# Patient Record
Sex: Male | Born: 1970 | Race: Black or African American | Hispanic: No | Marital: Single | Smoking: Never smoker
Health system: Southern US, Community
[De-identification: ages and names within clinical notes are randomized; demographics above are authoritative.]

## PROBLEM LIST (undated history)

## (undated) DIAGNOSIS — I10 Essential (primary) hypertension: Secondary | ICD-10-CM

## (undated) DIAGNOSIS — F1010 Alcohol abuse, uncomplicated: Secondary | ICD-10-CM

## (undated) DIAGNOSIS — B182 Chronic viral hepatitis C: Secondary | ICD-10-CM

## (undated) DIAGNOSIS — K2961 Other gastritis with bleeding: Secondary | ICD-10-CM

## (undated) DIAGNOSIS — F1910 Other psychoactive substance abuse, uncomplicated: Secondary | ICD-10-CM

## (undated) DIAGNOSIS — B181 Chronic viral hepatitis B without delta-agent: Secondary | ICD-10-CM

## (undated) DIAGNOSIS — K2210 Ulcer of esophagus without bleeding: Secondary | ICD-10-CM

## (undated) HISTORY — DX: Other gastritis with bleeding: K29.61

## (undated) HISTORY — DX: Ulcer of esophagus without bleeding: K22.10

## (undated) HISTORY — DX: Chronic viral hepatitis C: B18.2

## (undated) HISTORY — DX: Chronic viral hepatitis B without delta-agent: B18.1

---

## 2009-09-05 ENCOUNTER — Emergency Department (HOSPITAL_COMMUNITY): Admission: EM | Admit: 2009-09-05 | Discharge: 2009-09-06 | Payer: Self-pay | Admitting: Emergency Medicine

## 2009-09-27 ENCOUNTER — Emergency Department (HOSPITAL_COMMUNITY): Admission: EM | Admit: 2009-09-27 | Discharge: 2009-09-27 | Payer: Self-pay | Admitting: Emergency Medicine

## 2010-01-30 ENCOUNTER — Emergency Department (HOSPITAL_COMMUNITY): Admission: EM | Admit: 2010-01-30 | Discharge: 2010-01-31 | Payer: Self-pay | Admitting: Emergency Medicine

## 2010-01-31 ENCOUNTER — Ambulatory Visit: Payer: Self-pay | Admitting: Psychiatry

## 2010-01-31 ENCOUNTER — Inpatient Hospital Stay (HOSPITAL_COMMUNITY): Admission: AD | Admit: 2010-01-31 | Discharge: 2010-02-06 | Payer: Self-pay | Admitting: Psychiatry

## 2010-05-31 LAB — URINALYSIS, ROUTINE W REFLEX MICROSCOPIC
Nitrite: NEGATIVE
Protein, ur: NEGATIVE mg/dL
Specific Gravity, Urine: 1.008 (ref 1.005–1.030)
Urobilinogen, UA: 0.2 mg/dL (ref 0.0–1.0)

## 2010-05-31 LAB — DIFFERENTIAL
Basophils Absolute: 0 10*3/uL (ref 0.0–0.1)
Basophils Relative: 1 % (ref 0–1)
Eosinophils Relative: 1 % (ref 0–5)
Lymphocytes Relative: 52 % — ABNORMAL HIGH (ref 12–46)
Monocytes Absolute: 0.5 10*3/uL (ref 0.1–1.0)
Neutro Abs: 1.4 10*3/uL — ABNORMAL LOW (ref 1.7–7.7)

## 2010-05-31 LAB — CBC
Hemoglobin: 15.7 g/dL (ref 13.0–17.0)
MCH: 30.9 pg (ref 26.0–34.0)
MCV: 89.2 fL (ref 78.0–100.0)
Platelets: 197 10*3/uL (ref 150–400)
RBC: 5.07 MIL/uL (ref 4.22–5.81)
WBC: 4.1 10*3/uL (ref 4.0–10.5)

## 2010-05-31 LAB — COMPREHENSIVE METABOLIC PANEL
AST: 134 U/L — ABNORMAL HIGH (ref 0–37)
Albumin: 4.1 g/dL (ref 3.5–5.2)
Alkaline Phosphatase: 57 U/L (ref 39–117)
BUN: 8 mg/dL (ref 6–23)
CO2: 27 mEq/L (ref 19–32)
Chloride: 105 mEq/L (ref 96–112)
Creatinine, Ser: 0.98 mg/dL (ref 0.4–1.5)
GFR calc non Af Amer: >60 mL/min (ref >60)
Potassium: 4.1 mEq/L (ref 3.5–5.1)
Total Bilirubin: 1 mg/dL (ref 0.3–1.2)

## 2010-05-31 LAB — RAPID URINE DRUG SCREEN, HOSP PERFORMED
Barbiturates: NOT DETECTED
Opiates: NOT DETECTED

## 2010-06-05 LAB — CBC
HCT: 40.7 % (ref 39.0–52.0)
MCHC: 34.4 g/dL (ref 30.0–36.0)
Platelets: 159 10*3/uL (ref 150–400)
RDW: 16.5 % — ABNORMAL HIGH (ref 11.5–15.5)
WBC: 8.1 10*3/uL (ref 4.0–10.5)

## 2010-06-05 LAB — POCT I-STAT, CHEM 8
BUN: 11 mg/dL (ref 6–23)
Creatinine, Ser: 1 mg/dL (ref 0.4–1.5)
Glucose, Bld: 114 mg/dL — ABNORMAL HIGH (ref 70–99)
Sodium: 141 mEq/L (ref 135–145)
TCO2: 23 mmol/L (ref 0–100)

## 2010-06-05 LAB — DIFFERENTIAL
Basophils Relative: 1 % (ref 0–1)
Eosinophils Absolute: 0.1 10*3/uL (ref 0.0–0.7)
Eosinophils Relative: 1 % (ref 0–5)
Lymphs Abs: 1.5 10*3/uL (ref 0.7–4.0)
Monocytes Absolute: 0.9 10*3/uL (ref 0.1–1.0)
Monocytes Relative: 11 % (ref 3–12)
Neutrophils Relative %: 70 % (ref 43–77)

## 2010-06-05 LAB — COMPREHENSIVE METABOLIC PANEL
ALT: 78 U/L — ABNORMAL HIGH (ref 0–53)
AST: 70 U/L — ABNORMAL HIGH (ref 0–37)
Albumin: 3.6 g/dL (ref 3.5–5.2)
Alkaline Phosphatase: 81 U/L (ref 39–117)
Calcium: 9.6 mg/dL (ref 8.4–10.5)
GFR calc Af Amer: >60 mL/min (ref >60)
Potassium: 4.4 mEq/L (ref 3.5–5.1)
Sodium: 138 mEq/L (ref 135–145)
Total Protein: 8 g/dL (ref 6.0–8.3)

## 2010-08-30 ENCOUNTER — Emergency Department (HOSPITAL_COMMUNITY)
Admission: EM | Admit: 2010-08-30 | Discharge: 2010-08-31 | Disposition: A | Discharge status: Other Acute Inpatient Hospital | Payer: Medicare Other | Source: Home / Self Care | Attending: Emergency Medicine | Admitting: Emergency Medicine

## 2010-08-30 DIAGNOSIS — F1010 Alcohol abuse, uncomplicated: Secondary | ICD-10-CM | POA: Insufficient documentation

## 2010-08-30 DIAGNOSIS — Z79899 Other long term (current) drug therapy: Secondary | ICD-10-CM | POA: Insufficient documentation

## 2010-08-30 DIAGNOSIS — F1410 Cocaine abuse, uncomplicated: Secondary | ICD-10-CM | POA: Insufficient documentation

## 2010-08-30 DIAGNOSIS — F341 Dysthymic disorder: Secondary | ICD-10-CM | POA: Insufficient documentation

## 2010-08-30 LAB — COMPREHENSIVE METABOLIC PANEL
AST: 154 U/L — ABNORMAL HIGH (ref 0–37)
Albumin: 3.6 g/dL (ref 3.5–5.2)
Alkaline Phosphatase: 59 U/L (ref 39–117)
Chloride: 102 mEq/L (ref 96–112)
Potassium: 3.9 mEq/L (ref 3.5–5.1)
Total Bilirubin: 0.6 mg/dL (ref 0.3–1.2)

## 2010-08-30 LAB — RAPID URINE DRUG SCREEN, HOSP PERFORMED
Barbiturates: NOT DETECTED
Cocaine: POSITIVE — AB
Tetrahydrocannabinol: NOT DETECTED

## 2010-08-30 LAB — DIFFERENTIAL
Basophils Absolute: 0 10*3/uL (ref 0.0–0.1)
Eosinophils Absolute: 0.1 10*3/uL (ref 0.0–0.7)
Eosinophils Relative: 2 % (ref 0–5)
Lymphocytes Relative: 43 % (ref 12–46)
Neutrophils Relative %: 41 % — ABNORMAL LOW (ref 43–77)

## 2010-08-30 LAB — CBC
HCT: 43.4 % (ref 39.0–52.0)
Platelets: 180 10*3/uL (ref 150–400)
RBC: 4.95 MIL/uL (ref 4.22–5.81)
RDW: 14.3 % (ref 11.5–15.5)
WBC: 4.5 10*3/uL (ref 4.0–10.5)

## 2010-08-31 ENCOUNTER — Inpatient Hospital Stay (HOSPITAL_COMMUNITY)
Admission: AD | Admit: 2010-08-31 | Discharge: 2010-09-06 | DRG: 897 | Disposition: A | Discharge status: Home / Self Care | Payer: Medicare Other | Source: Ambulatory Visit | Attending: Psychiatry | Admitting: Psychiatry

## 2010-08-31 DIAGNOSIS — Z5900 Homelessness unspecified: Secondary | ICD-10-CM

## 2010-08-31 DIAGNOSIS — F29 Unspecified psychosis not due to a substance or known physiological condition: Secondary | ICD-10-CM

## 2010-08-31 DIAGNOSIS — R197 Diarrhea, unspecified: Secondary | ICD-10-CM

## 2010-08-31 DIAGNOSIS — F1010 Alcohol abuse, uncomplicated: Secondary | ICD-10-CM

## 2010-08-31 DIAGNOSIS — B1920 Unspecified viral hepatitis C without hepatic coma: Secondary | ICD-10-CM

## 2010-08-31 DIAGNOSIS — B1910 Unspecified viral hepatitis B without hepatic coma: Secondary | ICD-10-CM

## 2010-08-31 DIAGNOSIS — J310 Chronic rhinitis: Secondary | ICD-10-CM

## 2010-08-31 DIAGNOSIS — Z91199 Patient's noncompliance with other medical treatment and regimen due to unspecified reason: Secondary | ICD-10-CM

## 2010-08-31 DIAGNOSIS — Z9119 Patient's noncompliance with other medical treatment and regimen: Secondary | ICD-10-CM

## 2010-08-31 DIAGNOSIS — F1920 Other psychoactive substance dependence, uncomplicated: Principal | ICD-10-CM

## 2010-09-01 DIAGNOSIS — F1920 Other psychoactive substance dependence, uncomplicated: Secondary | ICD-10-CM

## 2010-09-05 LAB — HEPATIC FUNCTION PANEL
ALT: 153 U/L — ABNORMAL HIGH (ref 0–53)
Alkaline Phosphatase: 77 U/L (ref 39–117)
Bilirubin, Direct: 0.1 mg/dL (ref 0.0–0.3)
Indirect Bilirubin: 0.2 mg/dL — ABNORMAL LOW (ref 0.3–0.9)
Total Bilirubin: 0.3 mg/dL (ref 0.3–1.2)

## 2010-09-06 NOTE — H&P (Signed)
  NAMEFAVIAN, Brandon [name] NO.:  [id number]  MEDICAL RECORD NO.:  [id number]  LOCATION:  0401                          FACILITY:  [facility]  PHYSICIAN:  Eulogio Ditch, MD DATE OF BIRTH:  06/29/1970  DATE OF ADMISSION:  08/31/2010 DATE OF DISCHARGE:                      PSYCHIATRIC ADMISSION ASSESSMENT   REASON FOR ADMISSION:  Hearing voices.  HISTORY OF PRESENT ILLNESS:  A 40 year old male with history of abusing cocaine, alcohol and marijuana, came to the ED for reporting hearing voices and wanted help regarding his medications and residential placement.  I saw the patient along with the case manager.  The patient was very logical and goal directed during the interview.  No psychotic symptoms noted during the interview.  On asking about the voices, the patient early was saying that heard voices but then on further questioning he reported that he thinks these are the dreams only and he does not hear voices.  His main issue is the drugs and he wants help for that.  The patient reported depressed mood but denied any suicidal ideations.  SUBSTANCE ABUSE:  The patient has a chronic history of abusing alcohol, cocaine and marijuana.  The patient has admission in the past at [facility] and in [place] for drug abuse.  The patient was also given a diagnosis of schizophrenia in the past.  CURRENT PSYCH MEDICATION:  The patient is on Zoloft and Seroquel but was noncompliant with those medications.  MEDICAL HISTORY:  History of hepatitis B, hepatitis C.  ALLERGIES:  ASPIRIN.  PHYSICAL EXAMINATION:  Physical examination done in [facility] within normal limits.  Vitals within normal limits.  Labs within normal limits.  MENTAL STATUS EXAM:  The patient is calm, cooperative during interview. Fair eye contact.  No abnormal movements noticed.  No psychomotor agitation, retardation noted during the interview.  His speech is normal in rate, rhythm and volume.   Mood depressed.  Affect, mood congruent. Thought process logical and goal-directed.  Not delusional, not hallucinating, not internally preoccupied.  The patient denies having any suicidal or homicidal ideations.  Cognition:  Alert, awake, oriented x3.  Memory:  Immediate, recent remote fair.  Attention and concentration fair.  Abstraction ability good.  Insight and judgment intact.  DIAGNOSIS:  Axis I:  Polysubstance dependence, psychosis not otherwise specified. Axis II:  Deferred. Axis III:  See medical notes. Axis IV:  Chronic substance abuse. Axis V:  40.  TREATMENT PLAN: 1. The patient will be started on Risperdal 2 mg at bedtime for     complaint of hearing voices and it can be related to the drug     abuse. 2. Case manager is going to look for a rehab facility for the patient. 3. Estimated length of stay in the hospital will be 5-7 days. 4. At this time the patient has no withdrawal symptoms and he will not     be started on any detox protocol.     Eulogio Ditch, MD     SA/MEDQ  D:  09/01/2010  T:  09/01/2010  Job:  [id number]  Electronically Signed by Eulogio Ditch  on 09/06/2010 10:03:48 AM

## 2010-09-13 NOTE — Discharge Summary (Signed)
  Brandon [name], Brandon [name]           ACCOUNT NO.:  [id number]  MEDICAL RECORD NO.:  [id number]  LOCATION:  0300                          FACILITY:  [facility]  PHYSICIAN:  Eulogio Ditch, MD DATE OF BIRTH:  October 31, 1970  DATE OF ADMISSION:  08/31/2010 DATE OF DISCHARGE:  09/06/2010                              DISCHARGE SUMMARY   REASON FOR ADMISSION:  The patient stated he was hearing voices.  This is an 40 year old male abusing cocaine, alcohol, marijuana, hearing voices and wanting help regarding his medications and residential placement.  FINAL DIAGNOSES:  Axis I:  Polysubstance dependence, psychosis not otherwise specified. Axis II:  Deferred. Axis III:  History of hepatitis B and hepatitis C. Axis IV:  Chronic substance use, noncompliance, living situation. Axis V:  50.  PERTINENT LABS:  Urine drug screen positive for cocaine.  CBC within normal limits, does show neutrophils at 41.  CMP shows an SGOT elevated at 154 and an SGPT elevated at 171.  Alcohol level less than 11.  SIGNIFICANT FINDINGS:  The patient was admitted to the substance group, addressing his substance use and identifying any comorbidities.  We started the patient on Risperdal for his voices.  Case manager was going to pursue placement.  He was attending group and actively participating. His major concerns were his homelessness, having no family around, wanting to get treatment for alcohol and depression.  He wanted to get back on his Zoloft which we resumed.  He was having some problems with diarrhea and wanting his Seroquel rather than Risperdal restarted, stating that it had worked well in the past.  He was improving.  He was having no suicidal or homicidal thoughts.  He was alert and oriented. His mood was somewhat anxious.  His affect was congruent.  We did discontinue his Risperdal and started the patient on Seroquel 200 mg. On day of discharge, the patient was feeling better with the Seroquel, having  no problems with sleep, having some diarrhea.  He stated it was not bothersome.  He was planning to go to a shelter today.  DISCHARGE MEDICATIONS: 1. Seroquel 200 mg q.h.s. 2. Zoloft 50 mg for depression.  FOLLOWUP:  With [facility] on 6/20 at 8:00 a.m. at phone number [phone].     Landry Corporal, N.P.   ______________________________ Eulogio Ditch, MD    JO/MEDQ  D:  09/06/2010  T:  09/06/2010  Job:  [id number]  Electronically Signed by Limmie PatriciaP. on 09/07/2010 09:32:36 AM Electronically Signed by Eulogio Ditch  on 09/13/2010 12:07:47 PM

## 2016-03-15 NOTE — Congregational Nurse Program (Signed)
Congregational Nurse Program Note  Date of Encounter: 03/15/2016  Past Medical History: No past medical history on file.  Encounter Details:     CNP Questionnaire - 03/15/16 1125      Patient Demographics   Is this a new or existing patient? New   Patient is considered a/an Not Applicable   Race African-American/Black     Patient Assistance   Location of Patient Assistance Not Applicable   Patient's financial/insurance status Low Income;Medicaid   Patient referred to apply for the following financial assistance Not Applicable   Food insecurities addressed Not Applicable   Transportation assistance Yes   Type of Assistance Bus Pass Given   Assistance securing medications No   Educational health offerings Behavioral health;Navigating the healthcare system     Encounter Details   Primary purpose of visit Chronic Illness/Condition Visit;Navigating the Healthcare System   Was an Emergency Department visit averted? Not Applicable   Does patient have a medical provider? No   Patient referred to Clinic;Area Agency   Was a mental health screening completed? (GAINS tool) No   Does patient have dental issues? No   Does patient have vision issues? No   Does your patient have an abnormal blood pressure today? No   Since previous encounter, have you referred patient for abnormal blood pressure that resulted in a new diagnosis or medication change? No   Does your patient have an abnormal blood glucose today? No   Since previous encounter, have you referred patient for abnormal blood glucose that resulted in a new diagnosis or medication change? No   Was there a life-saving intervention made? No     Requesting assistance with obtaining medications.  [facility].  Provided information, directions and bus passes.  Client also wanted to get his SSD changed.  Provided information about going to [facility]

## 2016-03-21 ENCOUNTER — Other Ambulatory Visit: Payer: Self-pay

## 2016-03-30 ENCOUNTER — Emergency Department (HOSPITAL_COMMUNITY)
Admission: EM | Admit: 2016-03-30 | Discharge: 2016-03-31 | Disposition: A | Discharge status: Home / Self Care | Payer: Self-pay | Attending: Emergency Medicine | Admitting: Emergency Medicine

## 2016-03-30 ENCOUNTER — Encounter (HOSPITAL_COMMUNITY): Payer: Self-pay | Admitting: Emergency Medicine

## 2016-03-30 DIAGNOSIS — F17210 Nicotine dependence, cigarettes, uncomplicated: Secondary | ICD-10-CM | POA: Insufficient documentation

## 2016-03-30 DIAGNOSIS — I10 Essential (primary) hypertension: Secondary | ICD-10-CM | POA: Insufficient documentation

## 2016-03-30 DIAGNOSIS — R45851 Suicidal ideations: Secondary | ICD-10-CM | POA: Insufficient documentation

## 2016-03-30 DIAGNOSIS — F1910 Other psychoactive substance abuse, uncomplicated: Secondary | ICD-10-CM | POA: Insufficient documentation

## 2016-03-30 DIAGNOSIS — Z79899 Other long term (current) drug therapy: Secondary | ICD-10-CM | POA: Insufficient documentation

## 2016-03-30 HISTORY — DX: Essential (primary) hypertension: I10

## 2016-03-30 MED ORDER — VITAMIN B-1 100 MG PO TABS: 100.0000 mg | ORAL_TABLET | Freq: Every day | ORAL | Status: DC

## 2016-03-30 MED ORDER — FAMOTIDINE 20 MG PO TABS: 10.0000 mg | ORAL_TABLET | Freq: Every day | ORAL | Status: DC

## 2016-03-30 MED ORDER — TRAZODONE HCL 100 MG PO TABS: 100.0000 mg | ORAL_TABLET | Freq: Every evening | ORAL | Status: DC | PRN

## 2016-03-30 MED ORDER — ONDANSETRON HCL 4 MG PO TABS: 4.0000 mg | ORAL_TABLET | Freq: Three times a day (TID) | ORAL | Status: DC | PRN

## 2016-03-30 MED ORDER — THIAMINE HCL 100 MG/ML IJ SOLN: 100.0000 mg | Freq: Every day | INTRAMUSCULAR | Status: DC

## 2016-03-30 MED ORDER — ACETAMINOPHEN 325 MG PO TABS: 650.0000 mg | ORAL_TABLET | ORAL | Status: DC | PRN

## 2016-03-30 MED ORDER — ZOLPIDEM TARTRATE 5 MG PO TABS: 5.0000 mg | ORAL_TABLET | Freq: Every evening | ORAL | Status: DC | PRN

## 2016-03-30 MED ORDER — LORAZEPAM 2 MG/ML IJ SOLN
0.0000 mg | Freq: Four times a day (QID) | INTRAMUSCULAR | Status: DC
  Administered 2016-03-31: 2 mg via INTRAVENOUS
  Filled 2016-03-30: qty 1

## 2016-03-30 MED ORDER — LORAZEPAM 1 MG PO TABS: 1.0000 mg | ORAL_TABLET | Freq: Three times a day (TID) | ORAL | Status: DC | PRN

## 2016-03-30 MED ORDER — ALBUTEROL SULFATE HFA 108 (90 BASE) MCG/ACT IN AERS: 1.0000 | INHALATION_SPRAY | Freq: Four times a day (QID) | RESPIRATORY_TRACT | Status: DC | PRN

## 2016-03-30 MED ORDER — LORAZEPAM 2 MG/ML IJ SOLN: 0.0000 mg | Freq: Two times a day (BID) | INTRAMUSCULAR | Status: DC

## 2016-03-30 MED ORDER — SERTRALINE HCL 50 MG PO TABS: 100.0000 mg | ORAL_TABLET | Freq: Every day | ORAL | Status: DC

## 2016-03-30 NOTE — ED Provider Notes (Signed)
[facility]-EMERGENCY DEPT Provider Note   CSN: [id number] Arrival date & time: 03/30/16  2103     History   Chief Complaint Chief Complaint  Patient presents with  . Suicidal    HPI Brandon [name] is a 46 y.o. male.  HPI   Pt comes in with cc of suicidal ideation. Pt admits to cocaine use, heavy alcohol use. He has been having chronic suicidal thoughts, but the symptoms have gotten worse. PT is not taking home meds. Pt reports that he normally resides in [place] and staying here with a friend. Pt denies nausea, emesis, fevers, chills, chest pains, shortness of breath, headaches, abdominal pain, uti like symptoms.    Past Medical History:  Diagnosis Date  . Hypertension     There are no active problems to display for this patient.   History reviewed. No pertinent surgical history.     Home Medications    Prior to Admission medications   Medication Sig Start Date End Date Taking? Authorizing Provider  albuterol (PROVENTIL HFA;VENTOLIN HFA) 108 (90 Base) MCG/ACT inhaler Inhale 1-2 puffs into the lungs every 6 (six) hours as needed for wheezing or shortness of breath.   Yes Historical Provider, MD  famotidine (PEPCID) 10 MG tablet Take 10 mg by mouth daily.   Yes Historical Provider, MD  sertraline (ZOLOFT) 100 MG tablet Take 100 mg by mouth daily.   Yes Historical Provider, MD  traZODone (DESYREL) 100 MG tablet Take 100 mg by mouth at bedtime as needed for sleep.   Yes Historical Provider, MD    Family History No family history on file.  Social History Social History  Substance Use Topics  . Smoking status: Current Every Day Smoker    Packs/day: 0.50    Types: Cigarettes  . Smokeless tobacco: Never Used  . Alcohol use Yes     Comment: 5 drinks a day both beer and liquor     Allergies   Patient has no known allergies.   Review of Systems Review of Systems  ROS 10 Systems reviewed and are negative for acute change except as noted in the  HPI.     Physical Exam Updated Vital Signs BP 124/77 (BP Location: Right Arm)   Pulse 60   Temp 98.6 F (37 C) (Oral)   Resp 20   Ht 5\' 9"  (1.753 m)   Wt 145 lb (65.8 kg)   SpO2 98%   BMI 21.41 kg/m   Physical Exam  Constitutional: He is oriented to person, place, and time. He appears well-developed.  HENT:  Head: Atraumatic.  Neck: Neck supple.  Cardiovascular: Normal rate.   Pulmonary/Chest: Effort normal.  Neurological: He is alert and oriented to person, place, and time.  Skin: Skin is warm.  Nursing note and vitals reviewed.    ED Treatments / Results  Labs (all labs ordered are listed, but only abnormal results are displayed) Labs Reviewed  COMPREHENSIVE METABOLIC PANEL  ETHANOL  CBC WITH DIFFERENTIAL/PLATELET  RAPID URINE DRUG SCREEN, HOSP PERFORMED    EKG  EKG Interpretation None       Radiology No results found.  Procedures Procedures (including critical care time)  Medications Ordered in ED Medications - No data to display   Initial Impression / Assessment and Plan / ED Course  I have reviewed the triage vital signs and the nursing notes.  Pertinent labs & imaging results that were available during my care of the patient were reviewed by me and considered in my medical decision making (  see chart for details).  Clinical Course     DDx: Depression Bipolar disorder Schizophrenia Substance abuse Suicidal ideation Acute withdrawal  We will get basic labs and EKG. Whilst the labs are pending, pt has no medical complains and is medically cleared.  Final Clinical Impressions(s) / ED Diagnoses   Final diagnoses:  Suicidal ideation  Polysubstance abuse    New Prescriptions New Prescriptions   No medications on file     Derwood KaplanAnkit Barnabas Henriques, MD 03/30/16 2357

## 2016-03-30 NOTE — ED Triage Notes (Signed)
Pt states he is having suicidal thoughts. Pt states "living has not been too good." Pt states he has history of drug and alcohol abuse. Pt admits to drug use last night and alcohol use tonight. Pt had no plan today.

## 2016-03-30 NOTE — ED Notes (Signed)
ED Provider at bedside. 

## 2016-03-31 ENCOUNTER — Ambulatory Visit (HOSPITAL_COMMUNITY)
Admission: RE | Admit: 2016-03-31 | Discharge: 2016-03-31 | Disposition: A | Discharge status: Home / Self Care | Payer: Medicare HMO | Attending: Psychiatry | Admitting: Psychiatry

## 2016-03-31 DIAGNOSIS — R450 Nervousness: Secondary | ICD-10-CM | POA: Insufficient documentation

## 2016-03-31 DIAGNOSIS — F329 Major depressive disorder, single episode, unspecified: Secondary | ICD-10-CM | POA: Insufficient documentation

## 2016-03-31 DIAGNOSIS — G4700 Insomnia, unspecified: Secondary | ICD-10-CM | POA: Insufficient documentation

## 2016-03-31 LAB — COMPREHENSIVE METABOLIC PANEL
ALT: 72 U/L — ABNORMAL HIGH (ref 17–63)
ANION GAP: 5 (ref 5–15)
AST: 58 U/L — AB (ref 15–41)
Albumin: 3.4 g/dL — ABNORMAL LOW (ref 3.5–5.0)
Alkaline Phosphatase: 69 U/L (ref 38–126)
BUN: 20 mg/dL (ref 6–20)
CALCIUM: 8.4 mg/dL — AB (ref 8.9–10.3)
CO2: 24 mmol/L (ref 22–32)
CREATININE: 0.98 mg/dL (ref 0.61–1.24)
Chloride: 108 mmol/L (ref 101–111)
GLUCOSE: 96 mg/dL (ref 65–99)
Potassium: 4 mmol/L (ref 3.5–5.1)
SODIUM: 137 mmol/L (ref 135–145)
TOTAL PROTEIN: 7.3 g/dL (ref 6.5–8.1)
Total Bilirubin: 0.5 mg/dL (ref 0.3–1.2)

## 2016-03-31 LAB — CBC WITH DIFFERENTIAL/PLATELET
BASOS PCT: 0 %
Basophils Absolute: 0 10*3/uL (ref 0.0–0.1)
EOS ABS: 0.3 10*3/uL (ref 0.0–0.7)
EOS PCT: 4 %
HCT: 38.8 % — ABNORMAL LOW (ref 39.0–52.0)
Hemoglobin: 12.9 g/dL — ABNORMAL LOW (ref 13.0–17.0)
Lymphocytes Relative: 59 %
Lymphs Abs: 4.1 10*3/uL — ABNORMAL HIGH (ref 0.7–4.0)
MCH: 29.5 pg (ref 26.0–34.0)
MCHC: 33.2 g/dL (ref 30.0–36.0)
MCV: 88.8 fL (ref 78.0–100.0)
MONO ABS: 0.9 10*3/uL (ref 0.1–1.0)
MONOS PCT: 14 %
NEUTROS PCT: 23 %
Neutro Abs: 1.6 10*3/uL — ABNORMAL LOW (ref 1.7–7.7)
PLATELETS: 173 10*3/uL (ref 150–400)
RBC: 4.37 MIL/uL (ref 4.22–5.81)
RDW: 13.6 % (ref 11.5–15.5)
WBC: 6.9 10*3/uL (ref 4.0–10.5)

## 2016-03-31 LAB — ETHANOL: Alcohol, Ethyl (B): <5 mg/dL (ref <5)

## 2016-03-31 NOTE — Progress Notes (Signed)
Per Nira ConnJason Berry, NP does not meet inpatient criteria Sri Clegg K. Sherlon HandingHarris, LCAS-A, LPC-A, [facility]  Counselor 03/31/2016 12:42 AM

## 2016-03-31 NOTE — Discharge Instructions (Signed)
Follow-up with outpatient counseling as recommended by TTS consultants.  Return to the ER if symptoms worsen or change.
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
## 2016-03-31 NOTE — Consult Note (Signed)
Behavioral Health Medical Screening Exam  Brandon [name] is an 46 y.o. male.  Total Time spent with patient: 15 minutes  Psychiatric Specialty Exam: Physical Exam  Review of Systems  Psychiatric/Behavioral: Positive for depression. The patient is nervous/anxious and has insomnia.   All other systems reviewed and are negative.   There were no vitals taken for this visit.There is no height or weight on file to calculate BMI.  General Appearance: Casual and Fairly Groomed  Eye Contact:  Good  Speech:  Clear and Coherent and Normal Rate  Volume:  Normal  Mood:  Anxious and Depressed  Affect:  Appropriate, Congruent and Depressed  Thought Process:  Coherent, Goal Directed, Linear and Descriptions of Associations: Loose  Orientation:  Full (Time, Place, and Person)  Thought Content:  Focused on physical problems  Suicidal Thoughts:  No  Homicidal Thoughts:  No  Memory:  Immediate;   Fair Recent;   Fair Remote;   Fair  Judgement:  Fair  Insight:  Fair  Psychomotor Activity:  Normal  Concentration: Concentration: Fair and Attention Span: Fair  Recall:  FiservFair  Fund of Knowledge:Fair  Language: Fair  Akathisia:  No  Handed:    AIMS (if indicated):     Assets:  Communication Skills Desire for Improvement Resilience Social Support  Sleep:       Musculoskeletal: Strength & Muscle Tone: within normal limits Gait & Station: normal Patient leans: N/A  There were no vitals taken for this visit.  Recommendations:  Based on my evaluation the patient does not appear to have an emergency medical condition.  Beau FannyWithrow, John C, FNP 03/31/2016, 4:09 PM Agree with NP Consultation

## 2016-04-14 ENCOUNTER — Encounter: Payer: Self-pay | Admitting: Licensed Clinical Social Worker

## 2016-04-14 ENCOUNTER — Ambulatory Visit: Payer: Medicare HMO | Attending: Family Medicine | Admitting: Family Medicine

## 2016-04-14 ENCOUNTER — Encounter: Payer: Self-pay | Admitting: Family Medicine

## 2016-04-14 VITALS — BP 104/70 | HR 65 | Temp 98.0°F | Resp 18 | Ht 69.0 in | Wt 159.2 lb

## 2016-04-14 DIAGNOSIS — R200 Anesthesia of skin: Secondary | ICD-10-CM | POA: Diagnosis not present

## 2016-04-14 DIAGNOSIS — K0889 Other specified disorders of teeth and supporting structures: Secondary | ICD-10-CM

## 2016-04-14 DIAGNOSIS — M545 Low back pain, unspecified: Secondary | ICD-10-CM

## 2016-04-14 DIAGNOSIS — K029 Dental caries, unspecified: Secondary | ICD-10-CM | POA: Diagnosis not present

## 2016-04-14 DIAGNOSIS — F32A Depression, unspecified: Secondary | ICD-10-CM

## 2016-04-14 DIAGNOSIS — Z79899 Other long term (current) drug therapy: Secondary | ICD-10-CM | POA: Insufficient documentation

## 2016-04-14 DIAGNOSIS — F1910 Other psychoactive substance abuse, uncomplicated: Secondary | ICD-10-CM

## 2016-04-14 DIAGNOSIS — J45909 Unspecified asthma, uncomplicated: Secondary | ICD-10-CM | POA: Insufficient documentation

## 2016-04-14 DIAGNOSIS — Z590 Homelessness: Secondary | ICD-10-CM | POA: Insufficient documentation

## 2016-04-14 DIAGNOSIS — B353 Tinea pedis: Secondary | ICD-10-CM | POA: Diagnosis not present

## 2016-04-14 DIAGNOSIS — R21 Rash and other nonspecific skin eruption: Secondary | ICD-10-CM | POA: Insufficient documentation

## 2016-04-14 DIAGNOSIS — F329 Major depressive disorder, single episode, unspecified: Secondary | ICD-10-CM

## 2016-04-14 DIAGNOSIS — Z8709 Personal history of other diseases of the respiratory system: Secondary | ICD-10-CM

## 2016-04-14 MED ORDER — TERBINAFINE HCL 1 % EX CREA: 1.0000 "application " | TOPICAL_CREAM | Freq: Two times a day (BID) | CUTANEOUS | 1 refills | Status: DC

## 2016-04-14 MED ORDER — ALBUTEROL SULFATE HFA 108 (90 BASE) MCG/ACT IN AERS: 2.0000 | INHALATION_SPRAY | Freq: Four times a day (QID) | RESPIRATORY_TRACT | 3 refills | Status: AC | PRN

## 2016-04-14 MED ORDER — IBUPROFEN 600 MG PO TABS: 600.0000 mg | ORAL_TABLET | Freq: Three times a day (TID) | ORAL | 0 refills | Status: DC | PRN

## 2016-04-14 MED FILL — PROAIR HFA 90 MCG INHALER: 108 (90 BAS | 20 days supply | Qty: 9 | Fill #0

## 2016-04-14 MED FILL — IBUPROFEN 600 MG TABLET: 600 | 13 days supply | Qty: 40 | Fill #0

## 2016-04-14 NOTE — Progress Notes (Addendum)
Subjective:  Patient ID: Brandon [name], male    DOB: 11/05/1970  Age: 46 y.o. MRN: [id number]  CC: Establish Care  HPI Natanael Roseanne RenoHassan presents for tooth pain and musculoskeletal pain. He reports history of tooth pain for 1 year. Denies any fevers, facial swelling, or difficulty breathing. Denies any trauma. He does reports front lower teeth are loose. Reports history of back and shoulder pain. Denies any trauma. He denies any numbness, tingling, or weakness or the arms or legs. He reports numbness to fingertips last Wednesday when it snowed because gloves did not keep him warm. He is homeless. He c/o itchy and painful feet for 4 months. He denies any wounds but reports rash to soles of feet. Reports recent history of SI with ED admit and  psychiatrist follow up. He denies any SI/HI today.    Outpatient Medications Prior to Visit  Medication Sig Dispense Refill  . albuterol (PROVENTIL HFA;VENTOLIN HFA) 108 (90 Base) MCG/ACT inhaler Inhale 1-2 puffs into the lungs every 6 (six) hours as needed for wheezing or shortness of breath.    . famotidine (PEPCID) 10 MG tablet Take 10 mg by mouth daily.    . sertraline (ZOLOFT) 100 MG tablet Take 100 mg by mouth daily.    . traZODone (DESYREL) 100 MG tablet Take 100 mg by mouth at bedtime as needed for sleep.     No facility-administered medications prior to visit.     ROS Review of Systems  Constitutional: Negative.   Respiratory: Negative.   Cardiovascular: Negative.   Gastrointestinal: Negative.   Musculoskeletal: Positive for back pain, myalgias and neck pain.  Skin: Positive for rash (bilateral feet).  Psychiatric/Behavioral: Negative for suicidal ideas.   Objective:  BP 104/70 (BP Location: Right Arm, Patient Position: Sitting, Cuff Size: Normal)   Pulse 65   Temp 98 F (36.7 C) (Oral)   Resp 18   Ht 5\' 9"  (1.753 m)   Wt 159 lb 3.2 oz (72.2 kg)   SpO2 97%   BMI 23.51 kg/m   BP/Weight 04/14/2016 03/30/2016  Systolic BP 104  086124  Diastolic BP 70 77  Wt. (Lbs) 159.2 145  BMI 23.51 21.41   Depression screen [facility] 2/[facility] 04/14/2016  Decreased Interest 0  Down, Depressed, Hopeless 3  PHQ - 2 Score 3  Altered sleeping 3  Tired, decreased energy 2  Change in appetite 2  Feeling bad or failure about yourself  3  Trouble concentrating 0  Moving slowly or fidgety/restless 3  Suicidal thoughts 0  PHQ-9 Score 16    Physical Exam  Cardiovascular: Normal rate, regular rhythm, normal heart sounds and intact distal pulses.   Pulmonary/Chest: Effort normal and breath sounds normal.  Abdominal: Soft. Bowel sounds are normal.  Musculoskeletal: Normal range of motion.       Left shoulder: He exhibits pain.       Cervical back: He exhibits pain.       Lumbar back: He exhibits pain.  Skin: Rash (scaly, pruritic rash, no redness present. ) noted. No cyanosis (fingernails, capillary refill less than 3 seconds, acyanotic.).  Psychiatric: He expresses no suicidal plans and no homicidal plans.  Nursing note and vitals reviewed.  Assessment & Plan:   Problem List Items Addressed This Visit    None    Visit Diagnoses    Depression, unspecified depression type    -  Primary   - High PHQ-9 . LCSW asked to speak with patient and provide him with resources.  Relevant Orders   Ambulatory referral to Psychiatry   Polysubstance abuse       Teeth decayed       Relevant Medications   terbinafine (LAMISIL AT ATHLETES FOOT) 1 % cream   Other Relevant Orders   Ambulatory referral to Dentistry   Loose, teeth       Relevant Orders   Ambulatory referral to Dentistry   Personal history of asthma       Relevant Medications   albuterol (PROVENTIL HFA;VENTOLIN HFA) 108 (90 Base) MCG/ACT inhaler   Tinea pedis of both feet       Relevant Medications   terbinafine (LAMISIL AT ATHLETES FOOT) 1 % cream   Midline low back pain without sciatica, unspecified chronicity       Relevant Medications   ibuprofen (ADVIL,MOTRIN) 600 MG tablet      Meds ordered this encounter  Medications  . albuterol (PROVENTIL HFA;VENTOLIN HFA) 108 (90 Base) MCG/ACT inhaler    Sig: Inhale 2 puffs into the lungs every 6 (six) hours as needed for wheezing or shortness of breath.    Dispense:  8 g    Refill:  3    Order Specific Question:   Supervising Provider    Answer:   Quentin Angst [id number]  . terbinafine (LAMISIL AT ATHLETES FOOT) 1 % cream    Sig: Apply 1 application topically 2 (two) times daily. To bottom of feet and in between toes for 2 weeks.    Dispense:  30 g    Refill:  1    Order Specific Question:   Supervising Provider    Answer:   Quentin Angst [id number]  . ibuprofen (ADVIL,MOTRIN) 600 MG tablet    Sig: Take 1 tablet (600 mg total) by mouth every 8 (eight) hours as needed.    Dispense:  40 tablet    Refill:  0    Order Specific Question:   Supervising Provider    Answer:   Quentin Angst [[id number]]      Lizbeth Bark FNP

## 2016-04-14 NOTE — Progress Notes (Signed)
Patient complains about toothache that pain level is a 10  that been going on for 6 months  Patient stated that has back and neck pain mostly when tries to go to sleep  Patient also stated that been having a lot of nightmares   Patient also complain about no energy to walk he always tired   Patient stated that he don't feel safe at home he's homeless  Patient has not taking any meds today  Patient has not eaten today   Patient needs refill on the inhaler

## 2016-04-14 NOTE — BH Specialist Note (Signed)
Session Start time: 11:00 AM   End Time: 11:30 AM Total Time:  30 minutes Type of Service: Behavioral Health - Individual/Family Interpreter: No.   Interpreter Name & Language: N/A # [facility]-June [facility]   SUBJECTIVE: Brandon [name] is a 46 y.o. male  Pt. was referred by FNP Ha[place]on for:  anxiety, depression and community resources (housing). Pt. reports the following symptoms/concerns: overwhelming feelings of sadness and worry, nightmares, difficulty sleeping, and difficulty with memory and concentrating, homeless Duration of problem:  Ongoing Severity: severe Previous treatment: [facility]. He recently was assessed at ED for Si. Pt is not currently receiving services.   OBJECTIVE: Mood: Anxious & Affect: Depressed Risk of harm to self or others: Pt has hx of suicidal ideations, no report of audio/visual hallucinations. Pt denies plan or intent to self-harm and/or harm others Assessments administered: PHQ-9; GAD-7  LIFE CONTEXT:  Family & Social: [place] from [place] three weeks ago. He is currently homeless. Pt reports staying in shelters or in his friend's car. Pt has an older brother who resides in [place], speaks with him infrequently.  School/ Work: Pt is unemployed. He has Medicare coverage and is attempting to re-initiate Social Security Self-Care: Pt denies substance use. Per chart, pt admits to to cocaine and alcohol use (last date of use 03/30/16)  Life changes: Pt relocated to [place] three weeks ago and is homeless What is important to pt/family (values): Independence   GOALS ADDRESSED:  Decrease symptoms of depression Decrease symptoms of anxiety  INTERVENTIONS: Solution Focused, Strength-based and Supportive   ASSESSMENT:  Pt currently experiencing depression and anxiety triggered by financial strain and currently homelessness. Pt recently relocated from [place] three  weeks ago and has limited support. Pt reports overwhelming feelings of sadness and worry, nightmares, difficulty sleeping, and difficulty with memory and concentrating.  Pt may benefit from psychotherapy and medication management. Pt denied substance use; however, chart review showed polysubstance use (03/30/16)  LCSWA inquired protective factors with pt and assisted pt in creating safety plan. LCSWA discussed benefits of applying healthy coping skills to decrease symptoms. Pt identified healthy strategies to apply on a daily basis. Pt verbalized interest in obtaining medication management. LCSWA provided pt with community resources for crisis intervention, therapy, medication management, and food insecurity. Pt was provided transportation to assist in applying for Medicaid and housing through [employer].     PLAN: 1. F/U with behavioral health clinician: Pt was encouraged to contact LCSWA if symptoms worsen or fail to improve to schedule behavioral appointments [facility]. 2. Behavioral Health meds: Desyrel 3. Behavioral recommendations: LCSWA recommends that pt apply healthy coping skills discussed, initiate medication management, and utilize resources provided. Pt is encouraged to schedule follow up appointment with LCSWA 4. Referral: Brief Counseling/Psychotherapy, [facility] [employer], Problem-solving teaching/coping strategies, Psychoeducation and Supportive Counseling 5. From scale of 1-10, how likely are you to follow plan: 7/10   Bridgett LarssonJasmine D Helix Lafontaine, MSW, [facility]WA  Clinical Social Worker 04/14/16 4:42 PM  Warmhandoff:   Warm Hand Off Completed.

## 2016-04-28 ENCOUNTER — Ambulatory Visit: Payer: Self-pay | Admitting: Family Medicine

## 2016-05-05 NOTE — Congregational Nurse Program (Signed)
Congregational Nurse Program Note  Date of Encounter: 04/21/2016  Past Medical History: Past Medical History:  Diagnosis Date  . Hypertension     Encounter Details:     CNP Questionnaire - 04/21/16 1039      Patient Demographics   Is this a new or existing patient? New   Patient is considered a/an Refugee   Race Other     Patient Assistance   Location of Patient Assistance Not Applicable   Patient's financial/insurance status Low Income;Medicaid   Uninsured Patient ([employer]ange Research officer, trade unionCard/Care Connects) No   Patient referred to apply for the following financial assistance Medicaid   Food insecurities addressed Not Applicable   Transportation assistance No   Assistance securing medications No   Educational health offerings Behavioral health;Chronic disease;Navigating the healthcare system     Encounter Details   Primary purpose of visit Chronic Illness/Condition Visit;Post ED/Hospitalization Visit;Navigating the Healthcare System   Was an Emergency Department visit averted? Not Applicable   Does patient have a medical provider? Yes   Patient referred to Doctor referral for a non-emergent behavioral health crisis;Clinic   Was a mental health screening completed? (GAINS tool) No   Does patient have dental issues? No   Does patient have vision issues? No   Does your patient have an abnormal blood pressure today? No   Since previous encounter, have you referred patient for abnormal blood pressure that resulted in a new diagnosis or medication change? No   Does your patient have an abnormal blood glucose today? No   Since previous encounter, have you referred patient for abnormal blood glucose that resulted in a new diagnosis or medication change? No   Was there a life-saving intervention made? No         Clinical Intake - 04/14/16 1016      Pre-visit preparation   Pre-visit preparation completed Yes     Pain   Pain  0-10   Pain Score 10-Worst pain ever   Pain Type Acute pain   Pain Location Teeth   Pain Orientation Left     Nutrition Screen   Nutritional Risks None   Diabetes No     Functional Status   Activities of Daily Living Independent   Ambulation Independent   Medication Administration Independent   Home Management Independent     Risk/Barriers   Barriers to Care Management & Learning None   Psychosocial Barriers Financial need     Abuse/Neglect   Do you feel unsafe in your current relationship? No   Do you feel physically threatened by others? No   Anyone hurting you at home, work, or school? No   Unable to ask? No     Web designerLanguage Assistant   Interpreter Needed? No     Has a list of medications that he states [facility].  Contacted case Production designer, theatre/television/filmmanager, Jenel LucksJasmine Lewis.  Client never had scripts for the medications he wanted filled.  Appointment made for 2/9 to follow up for assessment of needed medications.

## 2016-05-23 NOTE — Congregational Nurse Program (Signed)
Congregational Nurse Program Note  Date of Encounter: 05/18/2016  Past Medical History: Past Medical History:  Diagnosis Date  . Hypertension     Encounter Details:     CNP Questionnaire - 05/22/16 1023      Patient Demographics   Is this a new or existing patient? Existing   Patient is considered a/an Refugee   Race Other     Patient Assistance   Location of Patient Assistance Not Applicable   Patient's financial/insurance status Low Income;Medicaid   Uninsured Patient ([employer] officer, trade unionCard/Care Connects) No   Patient referred to apply for the following financial assistance Medicaid   Food insecurities addressed Not Applicable   Transportation assistance Yes   Type of Assistance Bus Pass Given   Assistance securing medications No   Educational health offerings Behavioral health;Chronic disease;Navigating the healthcare system     Encounter Details   Primary purpose of visit Chronic Illness/Condition Visit;Post ED/Hospitalization Visit;Navigating the Healthcare System   Was an Emergency Department visit averted? Not Applicable   Does patient have a medical provider? Yes   Patient referred to Doctor referral for a non-emergent behavioral health crisis;Clinic   Was a mental health screening completed? (GAINS tool) No   Does patient have dental issues? No   Does patient have vision issues? No   Does your patient have an abnormal blood pressure today? No   Since previous encounter, have you referred patient for abnormal blood pressure that resulted in a new diagnosis or medication change? No   Does your patient have an abnormal blood glucose today? No   Since previous encounter, have you referred patient for abnormal blood glucose that resulted in a new diagnosis or medication change? No   Was there a life-saving intervention made? No     client requesting bus tickets to go the Social Services office to find out why his disability check has been decreased by nearly 200 dollars a  month.  Client anxious.  Bus tickets provided.

## 2016-06-09 ENCOUNTER — Encounter (HOSPITAL_COMMUNITY): Payer: Self-pay

## 2016-06-09 ENCOUNTER — Emergency Department (HOSPITAL_COMMUNITY)
Admission: EM | Admit: 2016-06-09 | Discharge: 2016-06-09 | Disposition: A | Discharge status: Home / Self Care | Payer: Medicare HMO | Attending: Emergency Medicine | Admitting: Emergency Medicine

## 2016-06-09 DIAGNOSIS — R45851 Suicidal ideations: Secondary | ICD-10-CM | POA: Insufficient documentation

## 2016-06-09 DIAGNOSIS — F1120 Opioid dependence, uncomplicated: Secondary | ICD-10-CM | POA: Diagnosis not present

## 2016-06-09 DIAGNOSIS — F1410 Cocaine abuse, uncomplicated: Secondary | ICD-10-CM | POA: Diagnosis present

## 2016-06-09 DIAGNOSIS — F17210 Nicotine dependence, cigarettes, uncomplicated: Secondary | ICD-10-CM | POA: Insufficient documentation

## 2016-06-09 DIAGNOSIS — F1994 Other psychoactive substance use, unspecified with psychoactive substance-induced mood disorder: Secondary | ICD-10-CM | POA: Diagnosis present

## 2016-06-09 DIAGNOSIS — Z79899 Other long term (current) drug therapy: Secondary | ICD-10-CM | POA: Diagnosis not present

## 2016-06-09 DIAGNOSIS — I10 Essential (primary) hypertension: Secondary | ICD-10-CM | POA: Insufficient documentation

## 2016-06-09 DIAGNOSIS — F1920 Other psychoactive substance dependence, uncomplicated: Secondary | ICD-10-CM

## 2016-06-09 DIAGNOSIS — F1414 Cocaine abuse with cocaine-induced mood disorder: Secondary | ICD-10-CM | POA: Diagnosis not present

## 2016-06-09 LAB — COMPREHENSIVE METABOLIC PANEL
ALT: 61 U/L (ref 17–63)
ANION GAP: 9 (ref 5–15)
AST: 64 U/L — ABNORMAL HIGH (ref 15–41)
Albumin: 4.4 g/dL (ref 3.5–5.0)
Alkaline Phosphatase: 65 U/L (ref 38–126)
BUN: 10 mg/dL (ref 6–20)
CHLORIDE: 104 mmol/L (ref 101–111)
CO2: 24 mmol/L (ref 22–32)
Calcium: 9.5 mg/dL (ref 8.9–10.3)
Creatinine, Ser: 0.95 mg/dL (ref 0.61–1.24)
GFR calc non Af Amer: >60 mL/min (ref >60)
Glucose, Bld: 109 mg/dL — ABNORMAL HIGH (ref 65–99)
POTASSIUM: 4.2 mmol/L (ref 3.5–5.1)
SODIUM: 137 mmol/L (ref 135–145)
Total Bilirubin: 0.7 mg/dL (ref 0.3–1.2)
Total Protein: 9.6 g/dL — ABNORMAL HIGH (ref 6.5–8.1)

## 2016-06-09 LAB — RAPID URINE DRUG SCREEN, HOSP PERFORMED
AMPHETAMINES: NOT DETECTED
BENZODIAZEPINES: NOT DETECTED
Barbiturates: NOT DETECTED
Cocaine: POSITIVE — AB
Opiates: NOT DETECTED
Tetrahydrocannabinol: NOT DETECTED

## 2016-06-09 LAB — CBC WITH DIFFERENTIAL/PLATELET
BASOS ABS: 0 10*3/uL (ref 0.0–0.1)
BASOS PCT: 0 %
EOS PCT: 3 %
Eosinophils Absolute: 0.2 10*3/uL (ref 0.0–0.7)
HEMATOCRIT: 46.2 % (ref 39.0–52.0)
Hemoglobin: 15.2 g/dL (ref 13.0–17.0)
LYMPHS PCT: 55 %
Lymphs Abs: 2.8 10*3/uL (ref 0.7–4.0)
MCH: 29.5 pg (ref 26.0–34.0)
MCHC: 32.9 g/dL (ref 30.0–36.0)
MCV: 89.5 fL (ref 78.0–100.0)
Monocytes Absolute: 0.4 10*3/uL (ref 0.1–1.0)
Monocytes Relative: 8 %
NEUTROS ABS: 1.7 10*3/uL (ref 1.7–7.7)
Neutrophils Relative %: 34 %
PLATELETS: 247 10*3/uL (ref 150–400)
RBC: 5.16 MIL/uL (ref 4.22–5.81)
RDW: 14.6 % (ref 11.5–15.5)
WBC: 5.1 10*3/uL (ref 4.0–10.5)

## 2016-06-09 LAB — URINALYSIS, ROUTINE W REFLEX MICROSCOPIC
Bilirubin Urine: NEGATIVE
GLUCOSE, UA: NEGATIVE mg/dL
Hgb urine dipstick: NEGATIVE
KETONES UR: NEGATIVE mg/dL
LEUKOCYTES UA: NEGATIVE
NITRITE: NEGATIVE
PROTEIN: NEGATIVE mg/dL
Specific Gravity, Urine: 1.013 (ref 1.005–1.030)
pH: 5 (ref 5.0–8.0)

## 2016-06-09 LAB — ACETAMINOPHEN LEVEL: ACETAMINOPHEN (TYLENOL), SERUM: 12 ug/mL (ref 10–30)

## 2016-06-09 LAB — SALICYLATE LEVEL

## 2016-06-09 LAB — ETHANOL: ALCOHOL ETHYL (B): 49 mg/dL — AB (ref <5)

## 2016-06-09 MED ORDER — NICOTINE 21 MG/24HR TD PT24: 21.0000 mg | MEDICATED_PATCH | Freq: Every day | TRANSDERMAL | Status: DC

## 2016-06-09 MED ORDER — ONDANSETRON HCL 4 MG PO TABS: 4.0000 mg | ORAL_TABLET | Freq: Three times a day (TID) | ORAL | Status: DC | PRN

## 2016-06-09 MED ORDER — LORAZEPAM 1 MG PO TABS: 0.0000 mg | ORAL_TABLET | Freq: Two times a day (BID) | ORAL | Status: DC

## 2016-06-09 MED ORDER — THIAMINE HCL 100 MG/ML IJ SOLN: 100.0000 mg | Freq: Every day | INTRAMUSCULAR | Status: DC

## 2016-06-09 MED ORDER — ALUM & MAG HYDROXIDE-SIMETH 200-200-20 MG/5ML PO SUSP: 30.0000 mL | ORAL | Status: DC | PRN

## 2016-06-09 MED ORDER — IBUPROFEN 200 MG PO TABS: 600.0000 mg | ORAL_TABLET | Freq: Three times a day (TID) | ORAL | Status: DC | PRN

## 2016-06-09 MED ORDER — LORAZEPAM 1 MG PO TABS: 0.0000 mg | ORAL_TABLET | Freq: Four times a day (QID) | ORAL | Status: DC

## 2016-06-09 MED ORDER — VITAMIN B-1 100 MG PO TABS: 100.0000 mg | ORAL_TABLET | Freq: Every day | ORAL | Status: DC

## 2016-06-09 MED ORDER — ACETAMINOPHEN 325 MG PO TABS: 650.0000 mg | ORAL_TABLET | ORAL | Status: DC | PRN

## 2016-06-09 MED ORDER — ZOLPIDEM TARTRATE 10 MG PO TABS: 10.0000 mg | ORAL_TABLET | Freq: Every evening | ORAL | Status: DC | PRN

## 2016-06-09 NOTE — ED Notes (Addendum)
Pt. In burgundy scrubs.pt. And belongings searched and wanded by security. Pt. Has 4 belongings bags. Pt. Has 1 pr. Brown boots, 1 brown belt, 1 camouflage pant, 1 green jacket, 1 blue t-shirt, 1 brown wallet(no money), 1 cigarette light in pant pocket, 1 pr. Brown gloves, 1 brown/white shower curtain, 1 blue shirt, 1 [place] [employer] t-shirt, 1 brown striped jacket, 1 blue jean pant, and no cell phone. Pt. Belongings locked up at the nurses station 9-12 cabinet.

## 2016-06-09 NOTE — ED Notes (Signed)
Bed: [facility] Expected date:  Expected time:  Means of arrival:  Comments: 

## 2016-06-09 NOTE — ED Provider Notes (Signed)
[facility]-EMERGENCY DEPT Provider Note   CSN: [id number] Arrival date & time: 06/09/16  0431  Time seen 04:50 AM   History   Chief Complaint Chief Complaint  Patient presents with  . Suicidal    HPI Brandon [name] is a 46 y.o. male.  HPI  patient states he moved here from [place] about 2 months ago. He states he moved because he wanted to stay away from old friends who did drugs. He states he had been sober of about a year however 2 weeks ago he started using cocaine and smoking heroin again. He states he is tired of what he is doing, he states he is having thoughts of hurting himself and asked what his plan would be he states "anything I can do" to harm himself. He denies homicidal ideation to me. He states that he has been admitted for rehabilitation and detox in the past, he also has a psychiatric diagnosis of bipolar disorder and nightmares. He states he has been on medications for that which he thinks helps however he is not taking it recently. He denies IV drug use.  PCP none  Past Medical History:  Diagnosis Date  . Hypertension     There are no active problems to display for this patient.   History reviewed. No pertinent surgical history.     Home Medications    Prior to Admission medications   Medication Sig Start Date End Date Taking? Authorizing Provider  albuterol (PROVENTIL HFA;VENTOLIN HFA) 108 (90 Base) MCG/ACT inhaler Inhale 2 puffs into the lungs every 6 (six) hours as needed for wheezing or shortness of breath. 04/14/16   Lizbeth Bark, FNP  ibuprofen (ADVIL,MOTRIN) 600 MG tablet Take 1 tablet (600 mg total) by mouth every 8 (eight) hours as needed. Patient not taking: Reported on 06/09/2016 04/14/16   Lizbeth Bark, FNP  terbinafine (LAMISIL AT ATHLETES FOOT) 1 % cream Apply 1 application topically 2 (two) times daily. To bottom of feet and in between toes for 2 weeks. Patient not taking: Reported on 06/09/2016 04/14/16   Lizbeth Bark, FNP      Family History No family history on file.  Social History Social History  Substance Use Topics  . Smoking status: Current Every Day Smoker    Packs/day: 0.50    Types: Cigarettes  . Smokeless tobacco: Never Used  . Alcohol use Yes     Comment: 5 drinks a day both beer and liquor  on disability for bipolar disorder   Allergies   Patient has no known allergies.   Review of Systems Review of Systems  All other systems reviewed and are negative.    Physical Exam Updated Vital Signs BP (!) 138/97 (BP Location: Left Arm)   Pulse 60   Temp 97.5 F (36.4 C) (Oral)   Resp 18   SpO2 99%   Vital signs normal    Physical Exam  Constitutional: He is oriented to person, place, and time. He appears well-developed and well-nourished.  Non-toxic appearance. He does not appear ill. No distress.  HENT:  Head: Normocephalic and atraumatic.  Right Ear: External ear normal.  Left Ear: External ear normal.  Nose: Nose normal. No mucosal edema or rhinorrhea.  Mouth/Throat: Oropharynx is clear and moist and mucous membranes are normal. No dental abscesses or uvula swelling.  Eyes: Conjunctivae and EOM are normal. Pupils are equal, round, and reactive to light.  Neck: Normal range of motion and full passive range of motion without pain. Neck  supple.  Cardiovascular: Normal rate, regular rhythm and normal heart sounds.  Exam reveals no gallop and no friction rub.   No murmur heard. Pulmonary/Chest: Effort normal and breath sounds normal. No respiratory distress. He has no wheezes. He has no rhonchi. He has no rales. He exhibits no tenderness and no crepitus.  Abdominal: Soft. Normal appearance and bowel sounds are normal. He exhibits no distension. There is no tenderness. There is no rebound and no guarding.  Musculoskeletal: Normal range of motion. He exhibits no edema or tenderness.  Moves all extremities well.   Neurological: He is alert and oriented to person, place, and time. He  has normal strength. No cranial nerve deficit.  Skin: Skin is warm, dry and intact. No rash noted. No erythema. No pallor.  Psychiatric: His mood appears not anxious. His speech is delayed. He is slowed. He exhibits a depressed mood. He expresses suicidal ideation. He expresses no homicidal ideation.  Very poor eye contact  Nursing note and vitals reviewed.    ED Treatments / Results  Labs (all labs ordered are listed, but only abnormal results are displayed) Results for orders placed or performed during the hospital encounter of 06/09/16  Acetaminophen level  Result Value Ref Range   Acetaminophen (Tylenol), Serum 12 10 - 30 ug/mL  Comprehensive metabolic panel  Result Value Ref Range   Sodium 137 135 - 145 mmol/L   Potassium 4.2 3.5 - 5.1 mmol/L   Chloride 104 101 - 111 mmol/L   CO2 24 22 - 32 mmol/L   Glucose, Bld 109 (H) 65 - 99 mg/dL   BUN 10 6 - 20 mg/dL   Creatinine, Ser 1.610.95 0.61 - 1.24 mg/dL   Calcium 9.5 8.9 - 09.610.3 mg/dL   Total Protein 9.6 (H) 6.5 - 8.1 g/dL   Albumin 4.4 3.5 - 5.0 g/dL   AST 64 (H) 15 - 41 U/L   ALT 61 17 - 63 U/L   Alkaline Phosphatase 65 38 - 126 U/L   Total Bilirubin 0.7 0.3 - 1.2 mg/dL   GFR calc non Af Amer >60 >60 mL/min   GFR calc Af Amer >60 >60 mL/min   Anion gap 9 5 - 15  Ethanol  Result Value Ref Range   Alcohol, Ethyl (B) 49 (H) <5 mg/dL  Salicylate level  Result Value Ref Range   Salicylate Lvl <7.0 2.8 - 30.0 mg/dL  CBC with Differential  Result Value Ref Range   WBC 5.1 4.0 - 10.5 K/uL   RBC 5.16 4.22 - 5.81 MIL/uL   Hemoglobin 15.2 13.0 - 17.0 g/dL   HCT 04.546.2 40.939.0 - 81.152.0 %   MCV 89.5 78.0 - 100.0 fL   MCH 29.5 26.0 - 34.0 pg   MCHC 32.9 30.0 - 36.0 g/dL   RDW 91.414.6 78.211.5 - 95.615.5 %   Platelets 247 150 - 400 K/uL   Neutrophils Relative % 34 %   Neutro Abs 1.7 1.7 - 7.7 K/uL   Lymphocytes Relative 55 %   Lymphs Abs 2.8 0.7 - 4.0 K/uL   Monocytes Relative 8 %   Monocytes Absolute 0.4 0.1 - 1.0 K/uL   Eosinophils Relative 3  %   Eosinophils Absolute 0.2 0.0 - 0.7 K/uL   Basophils Relative 0 %   Basophils Absolute 0.0 0.0 - 0.1 K/uL  Urinalysis, Routine w reflex microscopic  Result Value Ref Range   Color, Urine YELLOW YELLOW   APPearance CLEAR CLEAR   Specific Gravity, Urine 1.013 1.005 - 1.030  pH 5.0 5.0 - 8.0   Glucose, UA NEGATIVE NEGATIVE mg/dL   Hgb urine dipstick NEGATIVE NEGATIVE   Bilirubin Urine NEGATIVE NEGATIVE   Ketones, ur NEGATIVE NEGATIVE mg/dL   Protein, ur NEGATIVE NEGATIVE mg/dL   Nitrite NEGATIVE NEGATIVE   Leukocytes, UA NEGATIVE NEGATIVE  Urine rapid drug screen (hosp performed)  Result Value Ref Range   Opiates NONE DETECTED NONE DETECTED   Cocaine POSITIVE (A) NONE DETECTED   Benzodiazepines NONE DETECTED NONE DETECTED   Amphetamines NONE DETECTED NONE DETECTED   Tetrahydrocannabinol NONE DETECTED NONE DETECTED   Barbiturates NONE DETECTED NONE DETECTED   Laboratory interpretation all normal except +UDS, alcohol ingestion    EKG  EKG Interpretation None       Radiology No results found.  Procedures Procedures (including critical care time)  Medications Ordered in ED Medications  acetaminophen (TYLENOL) tablet 650 mg (not administered)  ibuprofen (ADVIL,MOTRIN) tablet 600 mg (not administered)  zolpidem (AMBIEN) tablet 10 mg (not administered)  nicotine (NICODERM CQ - dosed in mg/24 hours) patch 21 mg (not administered)  ondansetron (ZOFRAN) tablet 4 mg (not administered)  alum & mag hydroxide-simeth (MAALOX/MYLANTA) 200-200-20 MG/5ML suspension 30 mL (not administered)  LORazepam (ATIVAN) tablet 0-4 mg (not administered)    Followed by  LORazepam (ATIVAN) tablet 0-4 mg (not administered)  thiamine (VITAMIN B-1) tablet 100 mg (not administered)    Or  thiamine (B-1) injection 100 mg (not administered)     Initial Impression / Assessment and Plan / ED Course  I have reviewed the triage vital signs and the nursing notes.  Pertinent labs & imaging  results that were available during my care of the patient were reviewed by me and considered in my medical decision making (see chart for details).  Medical clearance labs ordered.   06:29 AM labs have resulted. Psych holding orders written.    Final Clinical Impressions(s) / ED Diagnoses   Final diagnoses:  Bipolar disorder, current episode depressed, severe, without psychotic features (HCC)  Cocaine abuse  Suicidal ideation    Disposition pending  Devoria Albe, MD, Concha Pyo, MD 06/09/16 [phone]

## 2016-06-09 NOTE — Discharge Instructions (Signed)
To help you maintain a sober lifestyle, a substance abuse treatment program may be beneficial to you.  Contact one of the following providers at your earliest opportunity to ask about enrolling in their program: ° °     [facility] °     [address] °     [place], [place] [address] °     [phone] °     New patients are seen at the walk-in clinic every Tuesday from 9:00 am - 12:00 pm. ° °     [facility] °     [address] °     [place], [place] [address] °     [phone] °

## 2016-06-09 NOTE — ED Notes (Signed)
1 bag of pt's belongings placed in cabinet for patient belongings 21-25. 9-12 cabinet full. Bag is labeled with pt sticker

## 2016-06-09 NOTE — ED Notes (Signed)
Pt to conference room to speak with tts

## 2016-06-09 NOTE — [facility] Assessment (Signed)
[facility] Assessment Progress Note  Per Thedore MinsMojeed Akintayo, MD, this pt does not require psychiatric hospitalization at this time.  [facility] abuse treatment referrals. Discharge instructions advise pt to follow up with [facility] or [facility].  Pt's nurse has been notified.  Doylene Canninghomas Kourosh Jablonsky, MA Triage Specialist [phone]

## 2016-06-09 NOTE — ED Notes (Signed)
Received copy of gpd observation form. Placed in patient's chart.

## 2016-06-09 NOTE — ED Notes (Signed)
Pt having suicidal thoughts, also admitting to having problem with cocaine and Heroin. Hx of bipolar.

## 2016-06-09 NOTE — ED Notes (Signed)
Pt given sandwich, drink. Offered bus pass. Declined

## 2016-06-09 NOTE — BH Assessment (Addendum)
Assessment Note  Brandon [name] is an 46 y.o. male African American who presents to [facility] ED per ED report: cc of suicidal ideation. Pt admits to cocaine use, heavy alcohol use, and recent heroin use. He has been having chronic suicidal thoughts, but the symptoms have gotten worse. PT is not taking home meds. Pt reports that he normally resides in [place] and staying here with a friend. He has been here for 2 months.  Patient states primary concern is to seek help/possible detox and c/o depression/SI. Patient acknowledges current SI no plan. Patient denies HI and AVH. Patient acknowledges S.A. With hx. Of cocaine use unspecified amount last use was yesterday 06/08/2016 and alcohol with last use on 06/08/2016 with 1-2 beers.. Patient acknowledges [facility] or around [facility] for SI and S.A. He has also received treatment [place], [place], [place], [place], [place], and various other places that he is unable to recall. Patient states is not seen outpatient currently.   Patient is dressed in scrubs and is alert and oriented x4. Patient speech was within normal limits and motor behavior appeared normal. Patient thought process is coherent. Patient does not appear to be responding to internal stimuli. Patient was   Diagnosis: Major Depressive Disorder, Severe, Recurrent Episode, Severe: Cociane Use Disorder, Severe; Alcohol Use Disorder, Severe  Past Medical History:  Past Medical History:  Diagnosis Date  . Hypertension     History reviewed. No pertinent surgical history.  Family History: No family history on file.  Social History:  reports that he has been smoking Cigarettes.  He has been smoking about 0.50 packs per day. He has never used smokeless tobacco. He reports that he drinks alcohol. He reports that he uses drugs, including Cocaine and Marijuana, about 4 times per week.  Additional Social History:  Alcohol / Drug Use Pain Medications: SEE  MAR Prescriptions: SEE MAR Over the Counter: SEE MAR Longest period of sobriety (when/how long): 2 years Negative Consequences of Use: Financial, Armed forces operational officerLegal, Personal relationships, Work / School Withdrawal Symptoms: Patient aware of relationship between substance abuse and physical/medical complications Substance #1 Name of Substance 1: Alcohol  1 - Age of First Use: 46 yrs old  1 - Amount (size/oz): 1-2 beers 1 - Frequency: "every other day" 1 - Duration: on-going  1 - Last Use / Amount: 06/08/2016 Substance #2 Name of Substance 2: Cocaine 2 - Age of First Use: 46 yrs old  2 - Amount (size/oz): "Whatever I can geet" 2 - Frequency: 2-5 times per week 2 - Duration: on-going  2 - Last Use / Amount: 06/08/2016 Substance #3 Name of Substance 3: Heroin 3 - Age of First Use: 46 yrs old  3 - Amount (size/oz): varies; "However much I can get" 3 - Frequency: "Not that often.Marland Kitchen.Marland Kitchen.I have only used 4x's" 3 - Duration: on-going  3 - Last Use / Amount: 06/08/2016  CIWA: CIWA-Ar BP: 131/90 Pulse Rate: 81 Nausea and Vomiting: mild nausea with no vomiting Tactile Disturbances: none Tremor: no tremor Auditory Disturbances: not present Paroxysmal Sweats: no sweat visible Visual Disturbances: not present Anxiety: no anxiety, at ease Headache, Fullness in Head: none present Agitation: normal activity Orientation and Clouding of Sensorium: disoriented for data by no more than 2 calendar days CIWA-Ar Total: 3 COWS: Clinical Opiate Withdrawal Scale (COWS) Resting Pulse Rate: Pulse Rate 81-100 Sweating: No report of chills or flushing Restlessness: Able to sit still Pupil Size: Pupils pinned or normal size for  room light Bone or Joint Aches: Not present Runny Nose or Tearing: Not present GI Upset: No GI symptoms Tremor: No tremor Yawning: No yawning Anxiety or Irritability: None Gooseflesh Skin: Skin is smooth COWS Total Score: 1  Allergies: No Known Allergies  Home Medications:  (Not in a  hospital admission)  OB/GYN Status:  No LMP for male patient.  General Assessment Data Location of Assessment: [facility] TTS Assessment: In system Is this a Tele or Face-to-Face Assessment?: Face-to-Face Is this an Initial Assessment or a Re-assessment for this encounter?: Initial Assessment Marital status: Single Maiden name:  (n/a) Is patient pregnant?: Yes Pregnancy Status: No Living Arrangements: Other (Comment) (living in between homes with friends ) Can pt return to current living arrangement?: Yes Admission Status: Voluntary Is patient capable of signing voluntary admission?: Yes Referral Source: Self/Family/Friend Insurance type:  Administrator )     Crisis Care Plan Living Arrangements: Other (Comment) (living in between homes with friends ) Legal Guardian: Other: (no legal guardian) Name of Psychiatrist:  (no psychiatrist ) Name of Therapist:  (no therapist )  Education Status Is patient currently in school?: No Current Grade:  (n/a) Highest grade of school patient has completed:  (n/a) Name of school:  (n/a) Contact person:  (n/a)  Risk to self with the past 6 months Suicidal Ideation: Yes-Currently Present Has patient been a risk to self within the past 6 months prior to admission? : No Suicidal Intent: No Has patient had any suicidal intent within the past 6 months prior to admission? : No Is patient at risk for suicide?: No Suicidal Plan?: No Has patient had any suicidal plan within the past 6 months prior to admission? : No Access to Means: No What has been your use of drugs/alcohol within the last 12 months?:  (n/a) Previous Attempts/Gestures: No How many times?:  (n/a) Other Self Harm Risks:  (n/a) Triggers for Past Attempts: Other (Comment) Intentional Self Injurious Behavior: None Family Suicide History: No Recent stressful life event(s): Other (Comment) (none specified ) Persecutory voices/beliefs?: No Depression: Yes Depression Symptoms: Feeling  angry/irritable, Feeling worthless/self pity, Loss of interest in usual pleasures, Fatigue, Isolating Substance abuse history and/or treatment for substance abuse?: No Suicide prevention information given to non-admitted patients: Not applicable  Risk to Others within the past 6 months Homicidal Ideation: No Does patient have any lifetime risk of violence toward others beyond the six months prior to admission? : No Thoughts of Harm to Others: No Current Homicidal Intent: No Current Homicidal Plan: No Access to Homicidal Means: No Identified Victim:  (n/a) History of harm to others?: No Assessment of Violence: None Noted Violent Behavior Description:  (calm and cooperative ) Does patient have access to weapons?: No Criminal Charges Pending?: No Does patient have a court date: No Is patient on probation?: No  Psychosis Hallucinations: Visual, Auditory (shadows moving; voices telling me they will choke me) Delusions: None noted  Mental Status Report Appearance/Hygiene: Disheveled Eye Contact: Good Motor Activity: Freedom of movement Speech: Logical/coherent Level of Consciousness: Alert Mood: Depressed Affect: Appropriate to circumstance Anxiety Level: None Thought Processes: Relevant, Coherent Judgement: Impaired Orientation: Person, Place, Time, Situation Obsessive Compulsive Thoughts/Behaviors: None  Cognitive Functioning Concentration: Good Memory: Remote Intact, Recent Intact IQ: Average Insight: Poor Impulse Control: Poor Appetite: Fair Weight Loss:  (none reported) Weight Gain:  (none reported) Sleep: No Change Total Hours of Sleep:  (4 to 5 hrs) Vegetative Symptoms: None  ADLScreening [facility] Assessment Services) Patient's cognitive ability adequate to safely complete  daily activities?: Yes Patient able to express need for assistance with ADLs?: Yes Independently performs ADLs?: Yes (appropriate for developmental age)  Prior Inpatient Therapy Prior Inpatient  Therapy: Yes Prior Therapy Dates:  (multiple facilities...patient unable to recall date) Prior Therapy Facilty/Provider(s):  (varies) Reason for Treatment:  (medication management, suicidal ideations, depression, subst)  Prior Outpatient Therapy Prior Outpatient Therapy: No Prior Therapy Dates:  (n/a) Prior Therapy Facilty/Provider(s):  (n/a) Reason for Treatment:  (n/a) Does patient have an ACCT team?: No Does patient have Intensive In-House Services?  : No Does patient have [facility] services? : No Does patient have P4CC services?: No  ADL Screening (condition at time of admission) Patient's cognitive ability adequate to safely complete daily activities?: Yes Is the patient deaf or have difficulty hearing?: No Does the patient have difficulty seeing, even when wearing glasses/contacts?: No Patient able to express need for assistance with ADLs?: Yes Independently performs ADLs?: Yes (appropriate for developmental age)       Abuse/Neglect Assessment (Assessment to be complete while patient is alone) Physical Abuse: Denies Verbal Abuse: Denies Sexual Abuse: Denies Exploitation of patient/patient's resources: Denies Self-Neglect: Denies Values / Beliefs Cultural Requests During Hospitalization: None Spiritual Requests During Hospitalization: None   Advance Directives (For Healthcare) Does Patient Have a Medical Advance Directive?: No Would patient like information on creating a medical advance directive?: No - Patient declined Nutrition Screen- [facility] Patient's home diet: Regular  Additional Information 1:1 In Past 12 Months?: No CIRT Risk: No Elopement Risk: No Does patient have medical clearance?: Yes     Disposition:  Disposition Initial Assessment Completed for this Encounter: Yes Disposition of Patient: Other dispositions (Per Nanine Means, DNP, no criteria for INPT) Other disposition(s): Other (Comment) (Refer to outpatient providers for [facility] and substance  abuse)  On Site Evaluation by:   Reviewed with Physician:    Melynda Ripple  06/09/2016 11:09 AM

## 2016-06-09 NOTE — ED Notes (Signed)
Spoke to tom; pt to be discharged with community resources

## 2016-06-15 ENCOUNTER — Ambulatory Visit: Payer: Self-pay | Admitting: Family Medicine

## 2016-08-28 ENCOUNTER — Encounter (HOSPITAL_COMMUNITY): Payer: Self-pay

## 2016-08-28 ENCOUNTER — Emergency Department (HOSPITAL_COMMUNITY): Payer: Medicare HMO

## 2016-08-28 ENCOUNTER — Emergency Department (HOSPITAL_COMMUNITY)
Admission: EM | Admit: 2016-08-28 | Discharge: 2016-08-28 | Disposition: A | Discharge status: Home / Self Care | Payer: Medicare HMO | Attending: Emergency Medicine | Admitting: Emergency Medicine

## 2016-08-28 DIAGNOSIS — Y999 Unspecified external cause status: Secondary | ICD-10-CM | POA: Insufficient documentation

## 2016-08-28 DIAGNOSIS — I10 Essential (primary) hypertension: Secondary | ICD-10-CM | POA: Diagnosis not present

## 2016-08-28 DIAGNOSIS — Y92009 Unspecified place in unspecified non-institutional (private) residence as the place of occurrence of the external cause: Secondary | ICD-10-CM | POA: Insufficient documentation

## 2016-08-28 DIAGNOSIS — Z79899 Other long term (current) drug therapy: Secondary | ICD-10-CM | POA: Insufficient documentation

## 2016-08-28 DIAGNOSIS — S022XXA Fracture of nasal bones, initial encounter for closed fracture: Secondary | ICD-10-CM | POA: Diagnosis not present

## 2016-08-28 DIAGNOSIS — S0992XA Unspecified injury of nose, initial encounter: Secondary | ICD-10-CM | POA: Diagnosis present

## 2016-08-28 DIAGNOSIS — Y939 Activity, unspecified: Secondary | ICD-10-CM | POA: Insufficient documentation

## 2016-08-28 DIAGNOSIS — S0240FA Zygomatic fracture, left side, initial encounter for closed fracture: Secondary | ICD-10-CM | POA: Insufficient documentation

## 2016-08-28 DIAGNOSIS — F17210 Nicotine dependence, cigarettes, uncomplicated: Secondary | ICD-10-CM | POA: Diagnosis not present

## 2016-08-28 MED ORDER — OXYMETAZOLINE HCL 0.05 % NA SOLN: 1.0000 | Freq: Two times a day (BID) | NASAL | 0 refills | Status: DC

## 2016-08-28 MED ORDER — OXYMETAZOLINE HCL 0.05 % NA SOLN
1.0000 | Freq: Once | NASAL | Status: AC
  Administered 2016-08-28: 1 via NASAL
  Filled 2016-08-28: qty 15

## 2016-08-28 MED ORDER — OXYCODONE-ACETAMINOPHEN 5-325 MG PO TABS: 2.0000 | ORAL_TABLET | ORAL | 0 refills | Status: DC | PRN

## 2016-08-28 MED ORDER — TETRACAINE HCL 0.5 % OP SOLN
2.0000 [drp] | Freq: Once | OPHTHALMIC | Status: AC
  Administered 2016-08-28: 2 [drp] via OPHTHALMIC
  Filled 2016-08-28: qty 4

## 2016-08-28 MED ORDER — HYDROCODONE-ACETAMINOPHEN 5-325 MG PO TABS
1.0000 | ORAL_TABLET | Freq: Once | ORAL | Status: AC
  Administered 2016-08-28: 1 via ORAL
  Filled 2016-08-28: qty 1

## 2016-08-28 MED ORDER — FLUORESCEIN SODIUM 0.6 MG OP STRP
1.0000 | ORAL_STRIP | Freq: Once | OPHTHALMIC | Status: AC
  Administered 2016-08-28: 1 via OPHTHALMIC
  Filled 2016-08-28: qty 1

## 2016-08-28 NOTE — ED Notes (Signed)
Pt c/o 9/10 headache.
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
## 2016-08-28 NOTE — Discharge Instructions (Signed)
You have fractured a bone in your nose and a bone underneath your eye. I have provided you with pain medication to take as needed for severe pain. Please take ibuprofen 600mg  every 6 hours for mild to moderate pain. I have also given you a nasal spray to use twice daily. Do not use this for >1 week. Please schedule an appointment to be seen by maxillofacial within today or tomorrow. I have provided the information for them on the following page. If you have worsening symptoms or new concerning symptoms you can return to the emergency department for re-evaluation.

## 2017-01-09 ENCOUNTER — Inpatient Hospital Stay (HOSPITAL_COMMUNITY)
Admission: EM | Admit: 2017-01-09 | Discharge: 2017-01-11 | DRG: 378 | Disposition: A | Discharge status: Home / Self Care | Payer: Medicare (Managed Care) | Attending: Internal Medicine | Admitting: Internal Medicine

## 2017-01-09 DIAGNOSIS — K210 Gastro-esophageal reflux disease with esophagitis: Secondary | ICD-10-CM | POA: Diagnosis present

## 2017-01-09 DIAGNOSIS — F1010 Alcohol abuse, uncomplicated: Secondary | ICD-10-CM | POA: Diagnosis present

## 2017-01-09 DIAGNOSIS — Z23 Encounter for immunization: Secondary | ICD-10-CM

## 2017-01-09 DIAGNOSIS — F329 Major depressive disorder, single episode, unspecified: Secondary | ICD-10-CM | POA: Diagnosis present

## 2017-01-09 DIAGNOSIS — Z9114 Patient's other noncompliance with medication regimen: Secondary | ICD-10-CM

## 2017-01-09 DIAGNOSIS — K319 Disease of stomach and duodenum, unspecified: Secondary | ICD-10-CM | POA: Diagnosis present

## 2017-01-09 DIAGNOSIS — F209 Schizophrenia, unspecified: Secondary | ICD-10-CM | POA: Diagnosis present

## 2017-01-09 DIAGNOSIS — F17210 Nicotine dependence, cigarettes, uncomplicated: Secondary | ICD-10-CM | POA: Diagnosis present

## 2017-01-09 DIAGNOSIS — K922 Gastrointestinal hemorrhage, unspecified: Principal | ICD-10-CM | POA: Diagnosis present

## 2017-01-09 DIAGNOSIS — K2961 Other gastritis with bleeding: Secondary | ICD-10-CM

## 2017-01-09 DIAGNOSIS — K2960 Other gastritis without bleeding: Secondary | ICD-10-CM | POA: Diagnosis present

## 2017-01-09 DIAGNOSIS — Z590 Homelessness: Secondary | ICD-10-CM

## 2017-01-09 DIAGNOSIS — D62 Acute posthemorrhagic anemia: Secondary | ICD-10-CM | POA: Diagnosis present

## 2017-01-09 DIAGNOSIS — Z79899 Other long term (current) drug therapy: Secondary | ICD-10-CM

## 2017-01-09 DIAGNOSIS — R739 Hyperglycemia, unspecified: Secondary | ICD-10-CM | POA: Diagnosis present

## 2017-01-09 DIAGNOSIS — B1920 Unspecified viral hepatitis C without hepatic coma: Secondary | ICD-10-CM | POA: Diagnosis present

## 2017-01-09 DIAGNOSIS — B1910 Unspecified viral hepatitis B without hepatic coma: Secondary | ICD-10-CM | POA: Diagnosis present

## 2017-01-09 DIAGNOSIS — I10 Essential (primary) hypertension: Secondary | ICD-10-CM | POA: Diagnosis present

## 2017-01-09 DIAGNOSIS — K2210 Ulcer of esophagus without bleeding: Secondary | ICD-10-CM | POA: Diagnosis present

## 2017-01-09 DIAGNOSIS — K3189 Other diseases of stomach and duodenum: Secondary | ICD-10-CM | POA: Diagnosis present

## 2017-01-09 DIAGNOSIS — K254 Chronic or unspecified gastric ulcer with hemorrhage: Secondary | ICD-10-CM | POA: Diagnosis present

## 2017-01-09 DIAGNOSIS — F1420 Cocaine dependence, uncomplicated: Secondary | ICD-10-CM | POA: Diagnosis present

## 2017-01-09 DIAGNOSIS — D649 Anemia, unspecified: Secondary | ICD-10-CM

## 2017-01-09 DIAGNOSIS — F1920 Other psychoactive substance dependence, uncomplicated: Secondary | ICD-10-CM | POA: Diagnosis present

## 2017-01-09 LAB — ABO/RH: ABO/RH(D): O POS

## 2017-01-09 LAB — URINALYSIS, ROUTINE W REFLEX MICROSCOPIC
Bilirubin Urine: NEGATIVE
GLUCOSE, UA: NEGATIVE mg/dL
HGB URINE DIPSTICK: NEGATIVE
Ketones, ur: NEGATIVE mg/dL
LEUKOCYTES UA: NEGATIVE
Nitrite: NEGATIVE
PH: 5 (ref 5.0–8.0)
Protein, ur: NEGATIVE mg/dL
SPECIFIC GRAVITY, URINE: 1.025 (ref 1.005–1.030)

## 2017-01-09 LAB — COMPREHENSIVE METABOLIC PANEL
ALT: 82 U/L — AB (ref 17–63)
ANION GAP: 5 (ref 5–15)
AST: 133 U/L — ABNORMAL HIGH (ref 15–41)
Albumin: 3.1 g/dL — ABNORMAL LOW (ref 3.5–5.0)
Alkaline Phosphatase: 58 U/L (ref 38–126)
BUN: 10 mg/dL (ref 6–20)
CHLORIDE: 106 mmol/L (ref 101–111)
CO2: 23 mmol/L (ref 22–32)
CREATININE: 1 mg/dL (ref 0.61–1.24)
Calcium: 8.4 mg/dL — ABNORMAL LOW (ref 8.9–10.3)
GFR calc non Af Amer: >60 mL/min (ref >60)
Glucose, Bld: 107 mg/dL — ABNORMAL HIGH (ref 65–99)
POTASSIUM: 3.7 mmol/L (ref 3.5–5.1)
SODIUM: 134 mmol/L — AB (ref 135–145)
Total Bilirubin: 0.3 mg/dL (ref 0.3–1.2)
Total Protein: 6.3 g/dL — ABNORMAL LOW (ref 6.5–8.1)

## 2017-01-09 LAB — CBC
HEMATOCRIT: 25.2 % — AB (ref 39.0–52.0)
HEMOGLOBIN: 8.2 g/dL — AB (ref 13.0–17.0)
MCH: 30.9 pg (ref 26.0–34.0)
MCHC: 32.5 g/dL (ref 30.0–36.0)
MCV: 95.1 fL (ref 78.0–100.0)
Platelets: 176 10*3/uL (ref 150–400)
RBC: 2.65 MIL/uL — AB (ref 4.22–5.81)
RDW: 16.7 % — ABNORMAL HIGH (ref 11.5–15.5)
WBC: 4.8 10*3/uL (ref 4.0–10.5)

## 2017-01-09 LAB — LIPASE, BLOOD: LIPASE: 42 U/L (ref 11–51)

## 2017-01-09 LAB — GLUCOSE, POCT (MANUAL RESULT ENTRY): POC GLUCOSE: 221 mg/dL — AB (ref 70–99)

## 2017-01-09 MED ORDER — ONDANSETRON 4 MG PO TBDP
4.0000 mg | ORAL_TABLET | Freq: Once | ORAL | Status: AC | PRN
  Administered 2017-01-09: 4 mg via ORAL

## 2017-01-09 MED ORDER — ONDANSETRON 4 MG PO TBDP
ORAL_TABLET | ORAL | Status: AC
Start: 2017-01-09 — End: 2017-01-10
  Filled 2017-01-09: qty 1

## 2017-01-09 NOTE — ED Triage Notes (Signed)
Pt endorses 3-4 days of diffuse abdominal pain, nausea vomiting and black tarry stools. Pt + hep B, Hep C + pancreatitis. Pt states has vomited 3-5 times in the last 24 hours. Pt hypotensive at clinic- appears in moderate distress presently. Skin warm and dry, no abd guarding noted.

## 2017-01-10 ENCOUNTER — Encounter (HOSPITAL_COMMUNITY): Payer: Self-pay | Admitting: Internal Medicine

## 2017-01-10 ENCOUNTER — Observation Stay (HOSPITAL_COMMUNITY): Payer: Medicare (Managed Care)

## 2017-01-10 DIAGNOSIS — D649 Anemia, unspecified: Secondary | ICD-10-CM

## 2017-01-10 DIAGNOSIS — Z590 Homelessness: Secondary | ICD-10-CM | POA: Diagnosis not present

## 2017-01-10 DIAGNOSIS — D62 Acute posthemorrhagic anemia: Secondary | ICD-10-CM | POA: Diagnosis present

## 2017-01-10 DIAGNOSIS — F10929 Alcohol use, unspecified with intoxication, unspecified: Secondary | ICD-10-CM

## 2017-01-10 DIAGNOSIS — F17210 Nicotine dependence, cigarettes, uncomplicated: Secondary | ICD-10-CM | POA: Diagnosis present

## 2017-01-10 DIAGNOSIS — I10 Essential (primary) hypertension: Secondary | ICD-10-CM | POA: Diagnosis present

## 2017-01-10 DIAGNOSIS — F209 Schizophrenia, unspecified: Secondary | ICD-10-CM | POA: Diagnosis present

## 2017-01-10 DIAGNOSIS — B1910 Unspecified viral hepatitis B without hepatic coma: Secondary | ICD-10-CM | POA: Diagnosis present

## 2017-01-10 DIAGNOSIS — K2210 Ulcer of esophagus without bleeding: Secondary | ICD-10-CM | POA: Diagnosis present

## 2017-01-10 DIAGNOSIS — R945 Abnormal results of liver function studies: Secondary | ICD-10-CM | POA: Insufficient documentation

## 2017-01-10 DIAGNOSIS — Z23 Encounter for immunization: Secondary | ICD-10-CM | POA: Diagnosis present

## 2017-01-10 DIAGNOSIS — B9681 Helicobacter pylori [H. pylori] as the cause of diseases classified elsewhere: Secondary | ICD-10-CM | POA: Diagnosis not present

## 2017-01-10 DIAGNOSIS — F1420 Cocaine dependence, uncomplicated: Secondary | ICD-10-CM | POA: Diagnosis present

## 2017-01-10 DIAGNOSIS — F329 Major depressive disorder, single episode, unspecified: Secondary | ICD-10-CM | POA: Diagnosis present

## 2017-01-10 DIAGNOSIS — K922 Gastrointestinal hemorrhage, unspecified: Secondary | ICD-10-CM | POA: Diagnosis present

## 2017-01-10 DIAGNOSIS — K254 Chronic or unspecified gastric ulcer with hemorrhage: Secondary | ICD-10-CM | POA: Diagnosis present

## 2017-01-10 DIAGNOSIS — F1410 Cocaine abuse, uncomplicated: Secondary | ICD-10-CM

## 2017-01-10 DIAGNOSIS — R739 Hyperglycemia, unspecified: Secondary | ICD-10-CM | POA: Diagnosis present

## 2017-01-10 DIAGNOSIS — R7989 Other specified abnormal findings of blood chemistry: Secondary | ICD-10-CM | POA: Insufficient documentation

## 2017-01-10 DIAGNOSIS — B1920 Unspecified viral hepatitis C without hepatic coma: Secondary | ICD-10-CM | POA: Diagnosis present

## 2017-01-10 DIAGNOSIS — Z79899 Other long term (current) drug therapy: Secondary | ICD-10-CM | POA: Diagnosis not present

## 2017-01-10 DIAGNOSIS — K319 Disease of stomach and duodenum, unspecified: Secondary | ICD-10-CM | POA: Diagnosis present

## 2017-01-10 DIAGNOSIS — F1920 Other psychoactive substance dependence, uncomplicated: Secondary | ICD-10-CM | POA: Diagnosis not present

## 2017-01-10 DIAGNOSIS — K2960 Other gastritis without bleeding: Secondary | ICD-10-CM | POA: Diagnosis present

## 2017-01-10 DIAGNOSIS — F1010 Alcohol abuse, uncomplicated: Secondary | ICD-10-CM | POA: Diagnosis present

## 2017-01-10 DIAGNOSIS — Z9114 Patient's other noncompliance with medication regimen: Secondary | ICD-10-CM | POA: Diagnosis not present

## 2017-01-10 DIAGNOSIS — K921 Melena: Secondary | ICD-10-CM | POA: Insufficient documentation

## 2017-01-10 DIAGNOSIS — K210 Gastro-esophageal reflux disease with esophagitis: Secondary | ICD-10-CM | POA: Diagnosis present

## 2017-01-10 DIAGNOSIS — K3189 Other diseases of stomach and duodenum: Secondary | ICD-10-CM | POA: Diagnosis present

## 2017-01-10 LAB — HEMOGLOBIN AND HEMATOCRIT, BLOOD
HEMATOCRIT: 21.2 % — AB (ref 39.0–52.0)
HEMOGLOBIN: 6.9 g/dL — AB (ref 13.0–17.0)

## 2017-01-10 LAB — BASIC METABOLIC PANEL
ANION GAP: 7 (ref 5–15)
BUN: 10 mg/dL (ref 6–20)
CO2: 21 mmol/L — ABNORMAL LOW (ref 22–32)
CREATININE: 0.9 mg/dL (ref 0.61–1.24)
Calcium: 7.8 mg/dL — ABNORMAL LOW (ref 8.9–10.3)
Chloride: 111 mmol/L (ref 101–111)
GLUCOSE: 132 mg/dL — AB (ref 65–99)
POTASSIUM: 4.3 mmol/L (ref 3.5–5.1)
SODIUM: 139 mmol/L (ref 135–145)

## 2017-01-10 LAB — CBC
HCT: 27.4 % — ABNORMAL LOW (ref 39.0–52.0)
HCT: 27.9 % — ABNORMAL LOW (ref 39.0–52.0)
HEMOGLOBIN: 8.8 g/dL — AB (ref 13.0–17.0)
HEMOGLOBIN: 8.9 g/dL — AB (ref 13.0–17.0)
MCH: 30.2 pg (ref 26.0–34.0)
MCH: 30.1 pg (ref 26.0–34.0)
MCHC: 32.1 g/dL (ref 30.0–36.0)
MCHC: 31.9 g/dL (ref 30.0–36.0)
MCV: 94.2 fL (ref 78.0–100.0)
MCV: 94.3 fL (ref 78.0–100.0)
PLATELETS: 160 10*3/uL (ref 150–400)
Platelets: 149 10*3/uL — ABNORMAL LOW (ref 150–400)
RBC: 2.91 MIL/uL — ABNORMAL LOW (ref 4.22–5.81)
RBC: 2.96 MIL/uL — AB (ref 4.22–5.81)
RDW: 16.1 % — AB (ref 11.5–15.5)
RDW: 16.9 % — ABNORMAL HIGH (ref 11.5–15.5)
WBC: 4.2 10*3/uL (ref 4.0–10.5)
WBC: 4.7 10*3/uL (ref 4.0–10.5)

## 2017-01-10 LAB — HIV ANTIBODY (ROUTINE TESTING W REFLEX): HIV Screen 4th Generation wRfx: NONREACTIVE

## 2017-01-10 LAB — PROTIME-INR
INR: 1.13
Prothrombin Time: 14.4 seconds (ref 11.4–15.2)

## 2017-01-10 LAB — MRSA PCR SCREENING: MRSA by PCR: NEGATIVE

## 2017-01-10 LAB — CBG MONITORING, ED
GLUCOSE-CAPILLARY: 101 mg/dL — AB (ref 65–99)
Glucose-Capillary: 121 mg/dL — ABNORMAL HIGH (ref 65–99)

## 2017-01-10 LAB — POC OCCULT BLOOD, ED: FECAL OCCULT BLD: POSITIVE — AB

## 2017-01-10 LAB — GLUCOSE, CAPILLARY: Glucose-Capillary: 124 mg/dL — ABNORMAL HIGH (ref 65–99)

## 2017-01-10 LAB — PREPARE RBC (CROSSMATCH)

## 2017-01-10 MED ORDER — PANTOPRAZOLE SODIUM 40 MG IV SOLR
40.0000 mg | Freq: Two times a day (BID) | INTRAVENOUS | Status: DC
Start: 2017-01-13 — End: 2017-01-11

## 2017-01-10 MED ORDER — DEXTROSE 5 % IV SOLN
1.0000 g | Freq: Once | INTRAVENOUS | Status: AC
  Administered 2017-01-10: 1 g via INTRAVENOUS
  Filled 2017-01-10: qty 10

## 2017-01-10 MED ORDER — ONDANSETRON HCL 4 MG PO TABS: 4.0000 mg | ORAL_TABLET | Freq: Four times a day (QID) | ORAL | Status: DC | PRN

## 2017-01-10 MED ORDER — ONDANSETRON HCL 4 MG/2ML IJ SOLN
4.0000 mg | Freq: Once | INTRAMUSCULAR | Status: AC
  Administered 2017-01-10: 4 mg via INTRAVENOUS
  Filled 2017-01-10: qty 2

## 2017-01-10 MED ORDER — FOLIC ACID 1 MG PO TABS
1.0000 mg | ORAL_TABLET | Freq: Every day | ORAL | Status: DC
  Administered 2017-01-11: 1 mg via ORAL
  Filled 2017-01-10: qty 1

## 2017-01-10 MED ORDER — SODIUM CHLORIDE 0.9 % IV SOLN
50.0000 ug/h | INTRAVENOUS | Status: DC
  Administered 2017-01-10 – 2017-01-11 (×3): 50 ug/h via INTRAVENOUS
  Filled 2017-01-10 (×5): qty 1

## 2017-01-10 MED ORDER — MORPHINE SULFATE (PF) 4 MG/ML IV SOLN
4.0000 mg | Freq: Once | INTRAVENOUS | Status: AC
  Administered 2017-01-10: 4 mg via INTRAVENOUS
  Filled 2017-01-10: qty 1

## 2017-01-10 MED ORDER — DEXTROSE 5 % IV SOLN
2.0000 g | INTRAVENOUS | Status: DC
  Administered 2017-01-10: 2 g via INTRAVENOUS
  Filled 2017-01-10 (×2): qty 2

## 2017-01-10 MED ORDER — SODIUM CHLORIDE 0.9 % IV SOLN
INTRAVENOUS | Status: DC
  Administered 2017-01-10: 20:00:00 via INTRAVENOUS

## 2017-01-10 MED ORDER — SODIUM CHLORIDE 0.9 % IV BOLUS (SEPSIS)
1000.0000 mL | Freq: Once | INTRAVENOUS | Status: AC
  Administered 2017-01-10: 1000 mL via INTRAVENOUS

## 2017-01-10 MED ORDER — VITAMIN B-1 100 MG PO TABS
100.0000 mg | ORAL_TABLET | Freq: Every day | ORAL | Status: DC
  Administered 2017-01-10 – 2017-01-11 (×2): 100 mg via ORAL
  Filled 2017-01-10 (×2): qty 1

## 2017-01-10 MED ORDER — SODIUM CHLORIDE 0.9 % IV SOLN
80.0000 mg | Freq: Once | INTRAVENOUS | Status: AC
  Administered 2017-01-10: 02:00:00 80 mg via INTRAVENOUS
  Filled 2017-01-10: qty 80

## 2017-01-10 MED ORDER — LORAZEPAM 1 MG PO TABS: 1.0000 mg | ORAL_TABLET | Freq: Four times a day (QID) | ORAL | Status: DC | PRN

## 2017-01-10 MED ORDER — LORAZEPAM 2 MG/ML IJ SOLN
1.0000 mg | Freq: Four times a day (QID) | INTRAMUSCULAR | Status: DC | PRN
  Administered 2017-01-10: 1 mg via INTRAVENOUS
  Filled 2017-01-10: qty 1

## 2017-01-10 MED ORDER — IOPAMIDOL (ISOVUE-300) INJECTION 61%
INTRAVENOUS | Status: AC
  Administered 2017-01-10: 100 mL
  Filled 2017-01-10: qty 100

## 2017-01-10 MED ORDER — ADULT MULTIVITAMIN W/MINERALS CH
1.0000 | ORAL_TABLET | Freq: Every day | ORAL | Status: DC
  Administered 2017-01-10 – 2017-01-11 (×2): 1 via ORAL
  Filled 2017-01-10 (×2): qty 1

## 2017-01-10 MED ORDER — ACETAMINOPHEN 325 MG PO TABS
650.0000 mg | ORAL_TABLET | Freq: Four times a day (QID) | ORAL | Status: DC | PRN
  Administered 2017-01-10: 650 mg via ORAL
  Filled 2017-01-10: qty 2

## 2017-01-10 MED ORDER — PANTOPRAZOLE SODIUM 40 MG IV SOLR
8.0000 mg/h | INTRAVENOUS | Status: DC
  Administered 2017-01-10 – 2017-01-11 (×3): 8 mg/h via INTRAVENOUS
  Filled 2017-01-10 (×5): qty 80

## 2017-01-10 MED ORDER — ACETAMINOPHEN 650 MG RE SUPP: 650.0000 mg | Freq: Four times a day (QID) | RECTAL | Status: DC | PRN

## 2017-01-10 MED ORDER — THIAMINE HCL 100 MG/ML IJ SOLN: 100.0000 mg | Freq: Every day | INTRAMUSCULAR | Status: DC

## 2017-01-10 MED ORDER — OCTREOTIDE LOAD VIA INFUSION
50.0000 ug | Freq: Once | INTRAVENOUS | Status: AC
  Administered 2017-01-10: 50 ug via INTRAVENOUS
  Filled 2017-01-10: qty 25

## 2017-01-10 MED ORDER — SODIUM CHLORIDE 0.9 % IV SOLN: 10.0000 mL/h | Freq: Once | INTRAVENOUS | Status: DC

## 2017-01-10 MED ORDER — PANTOPRAZOLE SODIUM 40 MG IV SOLR: 40.0000 mg | Freq: Once | INTRAVENOUS | Status: DC

## 2017-01-10 MED ORDER — ONDANSETRON HCL 4 MG/2ML IJ SOLN: 4.0000 mg | Freq: Four times a day (QID) | INTRAMUSCULAR | Status: DC | PRN

## 2017-01-10 NOTE — ED Notes (Signed)
Report called,

## 2017-01-10 NOTE — ED Notes (Signed)
Pt reports black tarry stools x 4 days. Pt also reports abd pain, worse on L side. Pt A&Ox4.

## 2017-01-10 NOTE — Progress Notes (Signed)
Patient ID: Brandon [name], male   DOB: 09/01/1970, 46 y.o.   MRN: [id number] Patient was admitted early this morning for GI bleeding and is currently on Protonix and octreotide drips. GI evaluation is pending. I have seen and examined the patient at bedside and discussed the plan of care. Continue H&H monitoring. Continue Protonix and octreotide drips. Repeat a.m. labs.

## 2017-01-10 NOTE — H&P (Signed)
History and Physical    Brandon [name] ZOX:[id number] DOB: 09/23/1970 DOA: 01/09/2017  PCP: Brandon BarkHairston, Mandesia R, FNP  Patient coming from: Home.  Chief Complaint: Rectal bleeding.  HPI: Brandon [name] is a 46 y.o. male with history of alcohol abuse and hepatitis C and B presents to the ER with complaint of rectal bleeding.  Patient states over the last 3 days he has noticed black stools and at first some frank rectal bleeding.  He has been feeling weak and dizzy.  Also has been having epigastric pain radiating to the back.  Admits to drinking alcohol.  Patient states he was told he had a gastric ulcer but denies having had any EGD.  ED Course: In the ER hemoglobin is around 8 and the last one in our system was around 15.  Blood pressure is in the low normal.  Stool for occult blood was positive.  1 unit of PRBC transfusion has been ordered.  Protonix and octreotide and antibiotics started.  Gastroenterologist Dr. Lavon PaganiniNandigam has been consulted.  Review of Systems: As per HPI, rest all negative.   Past Medical History:  Diagnosis Date  . Hypertension     History reviewed. No pertinent surgical history.   reports that he has been smoking Cigarettes.  He has been smoking about 0.50 packs per day. He has never used smokeless tobacco. He reports that he drinks alcohol. He reports that he uses drugs, including Cocaine and Marijuana, about 4 times per week.  No Known Allergies  Family History  Problem Relation Age of Onset  . Hypertension Other     Prior to Admission medications   Medication Sig Start Date End Date Taking? Authorizing Provider  albuterol (PROVENTIL HFA;VENTOLIN HFA) 108 (90 Base) MCG/ACT inhaler Inhale 2 puffs into the lungs every 6 (six) hours as needed for wheezing or shortness of breath. Patient not taking: Reported on 01/10/2017 04/14/16   Brandon BarkHairston, Mandesia R, FNP  ibuprofen (ADVIL,MOTRIN) 600 MG tablet Take 1 tablet (600 mg total) by mouth every 8 (eight)  hours as needed. Patient not taking: Reported on 06/09/2016 04/14/16   Brandon BarkHairston, Mandesia R, FNP  oxyCODONE-acetaminophen (PERCOCET/ROXICET) 5-325 MG tablet Take 2 tablets by mouth every 4 (four) hours as needed for severe pain. Patient not taking: Reported on 01/10/2017 08/28/16   Maczis, Elmer SowMichael M, PA-C  oxymetazoline (AFRIN NASAL SPRAY) 0.05 % nasal spray Place 1 spray into both nostrils 2 (two) times daily. Patient not taking: Reported on 01/10/2017 08/28/16   Maczis, Elmer SowMichael M, PA-C  terbinafine (LAMISIL AT ATHLETES FOOT) 1 % cream Apply 1 application topically 2 (two) times daily. To bottom of feet and in between toes for 2 weeks. Patient not taking: Reported on 06/09/2016 04/14/16   Brandon BarkHairston, Mandesia R, FNP    Physical Exam: Vitals:   01/10/17 0145 01/10/17 0215 01/10/17 0230 01/10/17 0231  BP: (!) 96/58 (!) 91/59 97/63 97/63   Pulse: 68 65 (!) 59 67  Resp: 20 14 19 14   Temp:  98.2 F (36.8 C)  97.9 F (36.6 C)  TempSrc:  Oral  Oral  SpO2: 100% 100% 99% 100%  Weight:      Height:          Constitutional: Moderately built and nourished. Vitals:   01/10/17 0145 01/10/17 0215 01/10/17 0230 01/10/17 0231  BP: (!) 96/58 (!) 91/59 97/63 97/63   Pulse: 68 65 (!) 59 67  Resp: 20 14 19 14   Temp:  98.2 F (36.8 C)  97.9 F (36.6 C)  TempSrc:  Oral  Oral  SpO2: 100% 100% 99% 100%  Weight:      Height:       Eyes: Anicteric no pallor. ENMT: No discharge from the ears eyes nose or mouth. Neck: No mass felt.  No neck rigidity. Respiratory: No rhonchi or crepitations. Cardiovascular: S1-S2 heard no murmurs appreciated. Abdomen: Soft mild epigastric tenderness no guarding or rigidity. Musculoskeletal: No edema. Skin: No rash. Neurologic: Alert awake oriented to time place and person.  Moves all extremities. Psychiatric: Appears normal.  Normal affect.   Labs on Admission: I have personally reviewed following labs and imaging studies  CBC:  Recent Labs Lab 01/09/17 1917  01/10/17 0117  WBC 4.8  --   HGB 8.2* 6.9*  HCT 25.2* 21.2*  MCV 95.1  --   PLT 176  --    Basic Metabolic Panel:  Recent Labs Lab 01/09/17 1917  NA 134*  K 3.7  CL 106  CO2 23  GLUCOSE 107*  BUN 10  CREATININE 1.00  CALCIUM 8.4*   GFR: Estimated Creatinine Clearance: 91.3 mL/min (by C-G formula based on SCr of 1 mg/dL). Liver Function Tests:  Recent Labs Lab 01/09/17 1917  AST 133*  ALT 82*  ALKPHOS 58  BILITOT 0.3  PROT 6.3*  ALBUMIN 3.1*    Recent Labs Lab 01/09/17 1917  LIPASE 42   No results for input(s): AMMONIA in the last 168 hours. Coagulation Profile: No results for input(s): INR, PROTIME in the last 168 hours. Cardiac Enzymes: No results for input(s): CKTOTAL, CKMB, CKMBINDEX, TROPONINI in the last 168 hours. BNP (last 3 results) No results for input(s): PROBNP in the last 8760 hours. HbA1C: No results for input(s): HGBA1C in the last 72 hours. CBG: No results for input(s): GLUCAP in the last 168 hours. Lipid Profile: No results for input(s): CHOL, HDL, LDLCALC, TRIG, CHOLHDL, LDLDIRECT in the last 72 hours. Thyroid Function Tests: No results for input(s): TSH, T4TOTAL, FREET4, T3FREE, THYROIDAB in the last 72 hours. Anemia Panel: No results for input(s): VITAMINB12, FOLATE, FERRITIN, TIBC, IRON, RETICCTPCT in the last 72 hours. Urine analysis:    Component Value Date/Time   COLORURINE YELLOW 01/09/2017 1924   APPEARANCEUR CLEAR 01/09/2017 1924   LABSPEC 1.025 01/09/2017 1924   PHURINE 5.0 01/09/2017 1924   GLUCOSEU NEGATIVE 01/09/2017 1924   HGBUR NEGATIVE 01/09/2017 1924   BILIRUBINUR NEGATIVE 01/09/2017 1924   KETONESUR NEGATIVE 01/09/2017 1924   PROTEINUR NEGATIVE 01/09/2017 1924   UROBILINOGEN 0.2 01/30/2010 0832   NITRITE NEGATIVE 01/09/2017 1924   LEUKOCYTESUR NEGATIVE 01/09/2017 1924   Sepsis Labs: @LABRCNTIP (procalcitonin:4,lacticidven:4) )No results found for this or any previous visit (from the past 240 hour(s)).    Radiological Exams on Admission: No results found.   Assessment/Plan Principal Problem:   Acute GI bleeding Active Problems:   Polysubstance dependence including opioid type drug without complication, episodic abuse (HCC)   Acute blood loss anemia    1. Acute GI bleeding -likely upper GI.  Patient is placed on Protonix and octreotide infusion.  1 unit of PRBC has been ordered.  Recheck CBC after transfusion.  Gastroenterologist Dr. Lavon Paganini has been consulted.  Likely will be having EGD this morning. 2. Acute blood loss anemia -follow CBC. 3. Epigastric abdominal pain -could be from peptic ulcer disease or possible pancreatitis.  Check lipase after transfusion.  Abdomen is soft with mild tenderness. 4. History of hepatitis C and B -will need further workup as outpatient. 5. Alcohol abuse -patient is on CIWA protocol.  DVT prophylaxis: SCDs. Code Status: Full code. Family Communication: Discussed with patient. Disposition Plan: Home. Consults called: Gastroenterology. Admission status: Observation.   Eduard Clos MD [facility] Pager [phone].  If 7PM-7AM, please contact night-coverage [email or web address] Password [email or web address]  01/10/2017, 2:52 AM

## 2017-01-10 NOTE — ED Notes (Addendum)
Hemoccult card at bedside. Pt undressing at this time

## 2017-01-10 NOTE — ED Provider Notes (Signed)
[facility] EMERGENCY DEPARTMENT Provider Note   CSN: [id number] Arrival date & time: 01/09/17  1851     History   Chief Complaint Chief Complaint  Patient presents with  . Abdominal Pain    HPI Brandon [name] is a 46 y.o. male.  HPI  This is a 46 year old male with a history of hypertension, hepatitis B, hepatitis C, pancreatitis, alcohol abuse who presents with abdominal pain, dizziness, and dark stools. Patient reports 3-4 day history of worsening epigastric pain. He denies any bloody vomit but does state that it is dark. He has noted lack stools over this time. He reports dizziness and near syncopal episodes. Regarding his abdominal pain he rates his pain 8 out of 10. It radiates to his back.  Patient reports history of similar symptoms in the past where he had an endoscopy. This was done in an outside hospital. He is unsure what his diagnosis was.  Past Medical History:  Diagnosis Date  . Hypertension     Patient Active Problem List   Diagnosis Date Noted  . Acute GI bleeding 01/10/2017  . Acute blood loss anemia 01/10/2017  . Polysubstance dependence including opioid type drug without complication, episodic abuse (HCC) 06/09/2016  . Substance induced mood disorder (HCC) 06/09/2016    History reviewed. No pertinent surgical history.     Home Medications    Prior to Admission medications   Medication Sig Start Date End Date Taking? Authorizing Provider  albuterol (PROVENTIL HFA;VENTOLIN HFA) 108 (90 Base) MCG/ACT inhaler Inhale 2 puffs into the lungs every 6 (six) hours as needed for wheezing or shortness of breath. Patient not taking: Reported on 01/10/2017 04/14/16   Lizbeth Bark, FNP  ibuprofen (ADVIL,MOTRIN) 600 MG tablet Take 1 tablet (600 mg total) by mouth every 8 (eight) hours as needed. Patient not taking: Reported on 06/09/2016 04/14/16   Lizbeth Bark, FNP  oxyCODONE-acetaminophen (PERCOCET/ROXICET) 5-325 MG tablet Take  2 tablets by mouth every 4 (four) hours as needed for severe pain. Patient not taking: Reported on 01/10/2017 08/28/16   Maczis, Elmer Sow, PA-C  oxymetazoline (AFRIN NASAL SPRAY) 0.05 % nasal spray Place 1 spray into both nostrils 2 (two) times daily. Patient not taking: Reported on 01/10/2017 08/28/16   Maczis, Elmer Sow, PA-C  terbinafine (LAMISIL AT ATHLETES FOOT) 1 % cream Apply 1 application topically 2 (two) times daily. To bottom of feet and in between toes for 2 weeks. Patient not taking: Reported on 06/09/2016 04/14/16   Lizbeth Bark, FNP    Family History Family History  Problem Relation Age of Onset  . Hypertension Other     Social History Social History  Substance Use Topics  . Smoking status: Current Every Day Smoker    Packs/day: 0.50    Types: Cigarettes  . Smokeless tobacco: Never Used  . Alcohol use Yes     Comment: 5 drinks a day both beer and liquor     Allergies   Patient has no known allergies.   Review of Systems Review of Systems  Constitutional: Negative for fever.  Respiratory: Negative for shortness of breath.   Cardiovascular: Negative for chest pain.  Gastrointestinal: Positive for abdominal pain, blood in stool, nausea and vomiting.  Genitourinary: Negative for dysuria.  Neurological: Positive for dizziness. Negative for syncope.  All other systems reviewed and are negative.    Physical Exam Updated Vital Signs BP 100/64   Pulse 70   Temp 97.9 F (36.6 C) (Oral)   Resp  16   Ht 5\' 9"  (1.753 m)   Wt 72.6 kg (160 lb)   SpO2 97%   BMI 23.63 kg/m   Physical Exam  Constitutional: He is oriented to person, place, and time. He appears well-developed and well-nourished.  Chronically ill-appearing, nontoxic, no acute distress  HENT:  Head: Normocephalic and atraumatic.  Mucous membranes dry  Cardiovascular: Normal rate, regular rhythm and normal heart sounds.   No murmur heard. Pulmonary/Chest: Effort normal and breath sounds  normal. No respiratory distress. He has no wheezes.  Abdominal: Soft. Bowel sounds are normal. There is tenderness. There is no rebound.  Epigastric and right upper quadrant tenderness to palpation  Genitourinary: Rectal exam shows guaiac positive stool.  Genitourinary Comments: Scant stool in the rectal vault, no gross blood  Musculoskeletal: He exhibits no edema.  Neurological: He is alert and oriented to person, place, and time.  Skin: Skin is warm and dry.  Psychiatric: He has a normal mood and affect.  Nursing note and vitals reviewed.    ED Treatments / Results  Labs (all labs ordered are listed, but only abnormal results are displayed) Labs Reviewed  COMPREHENSIVE METABOLIC PANEL - Abnormal; Notable for the following:       Result Value   Sodium 134 (*)    Glucose, Bld 107 (*)    Calcium 8.4 (*)    Total Protein 6.3 (*)    Albumin 3.1 (*)    AST 133 (*)    ALT 82 (*)    All other components within normal limits  CBC - Abnormal; Notable for the following:    RBC 2.65 (*)    Hemoglobin 8.2 (*)    HCT 25.2 (*)    RDW 16.7 (*)    All other components within normal limits  HEMOGLOBIN AND HEMATOCRIT, BLOOD - Abnormal; Notable for the following:    Hemoglobin 6.9 (*)    HCT 21.2 (*)    All other components within normal limits  POC OCCULT BLOOD, ED - Abnormal; Notable for the following:    Fecal Occult Bld POSITIVE (*)    All other components within normal limits  LIPASE, BLOOD  URINALYSIS, ROUTINE W REFLEX MICROSCOPIC  HIV ANTIBODY (ROUTINE TESTING)  BASIC METABOLIC PANEL  CBC  POC OCCULT BLOOD, ED  TYPE AND SCREEN  ABO/RH  PREPARE RBC (CROSSMATCH)    EKG  EKG Interpretation  Date/Time:  Wednesday January 10 2017 01:16:20 EDT Ventricular Rate:  63 PR Interval:    QRS Duration: 93 QT Interval:  451 QTC Calculation: 462 R Axis:   45 Text Interpretation:  Sinus rhythm Confirmed by Ross Marcus ([id number]) on 01/10/2017 3:28:08 AM       Radiology No  results found.  Procedures Procedures (including critical care time)  CRITICAL CARE Performed by: Shon Baton   Total critical care time: 45 minutes  Critical care time was exclusive of separately billable procedures and treating other patients.  Critical care was necessary to treat or prevent imminent or life-threatening deterioration.  Critical care was time spent personally by me on the following activities: development of treatment plan with patient and/or surrogate as well as nursing, discussions with consultants, evaluation of patient's response to treatment, examination of patient, obtaining history from patient or surrogate, ordering and performing treatments and interventions, ordering and review of laboratory studies, ordering and review of radiographic studies, pulse oximetry and re-evaluation of patient's condition.   Medications Ordered in ED Medications  pantoprazole (PROTONIX) 80 mg in sodium chloride  0.9 % 250 mL (0.32 mg/mL) infusion (8 mg/hr Intravenous New Bag/Given 01/10/17 0138)  pantoprazole (PROTONIX) injection 40 mg (not administered)  octreotide (SANDOSTATIN) 2 mcg/mL load via infusion 50 mcg (50 mcg Intravenous Bolus from Bag 01/10/17 0135)    And  octreotide (SANDOSTATIN) 500 mcg in sodium chloride 0.9 % 250 mL (2 mcg/mL) infusion (50 mcg/hr Intravenous New Bag/Given 01/10/17 0137)  0.9 %  sodium chloride infusion (not administered)  LORazepam (ATIVAN) tablet 1 mg (not administered)    Or  LORazepam (ATIVAN) injection 1 mg (not administered)  thiamine (VITAMIN B-1) tablet 100 mg (not administered)    Or  thiamine (B-1) injection 100 mg (not administered)  folic acid (FOLVITE) tablet 1 mg (not administered)  multivitamin with minerals tablet 1 tablet (not administered)  acetaminophen (TYLENOL) tablet 650 mg (not administered)    Or  acetaminophen (TYLENOL) suppository 650 mg (not administered)  ondansetron (ZOFRAN) tablet 4 mg (not administered)      Or  ondansetron (ZOFRAN) injection 4 mg (not administered)  cefTRIAXone (ROCEPHIN) 2 g in dextrose 5 % 50 mL IVPB (not administered)  ondansetron (ZOFRAN-ODT) disintegrating tablet 4 mg (4 mg Oral Given 01/09/17 1912)  morphine 4 MG/ML injection 4 mg (4 mg Intravenous Given 01/10/17 0047)  ondansetron (ZOFRAN) injection 4 mg (4 mg Intravenous Given 01/10/17 0047)  sodium chloride 0.9 % bolus 1,000 mL (0 mLs Intravenous Stopped 01/10/17 0122)  cefTRIAXone (ROCEPHIN) 1 g in dextrose 5 % 50 mL IVPB (0 g Intravenous Stopped 01/10/17 0118)  pantoprazole (PROTONIX) 80 mg in sodium chloride 0.9 % 100 mL IVPB (0 mg Intravenous Stopped 01/10/17 0154)  sodium chloride 0.9 % bolus 1,000 mL (1,000 mLs Intravenous New Bag/Given 01/10/17 0219)     Initial Impression / Assessment and Plan / ED Course  I have reviewed the triage vital signs and the nursing notes.  Pertinent labs & imaging results that were available during my care of the patient were reviewed by me and considered in my medical decision making (see chart for details).     Patient presents with abdominal pain, dark stools, near-syncope.Blood pressure is in the 90s. However he is not tachycardic or unstable. Suspect acute GI bleed. He has multiple risk factors including alcohol abuse and hepatitis.He does not appear to be actively bleeding on exam. Hemoglobin is 6.9. He was typed and screened. One unit of blood ordered. He was given 1 L of fluids, IV Rocephin, protonic strip, and octreotide drip area and I discussed the patient with GI, Dr. Lavon PaganiniNandigam.  Plan for admission to the stepdown unit under the hospitalist.  Final Clinical Impressions(s) / ED Diagnoses   Final diagnoses:  Acute GI bleeding  Symptomatic anemia    New Prescriptions New Prescriptions   No medications on file     Shon BatonHorton, Latrena Benegas F, MD 01/10/17 [phone]

## 2017-01-10 NOTE — Progress Notes (Signed)
Pharmacy Antibiotic Note  Brandon [name] is a 46 y.o. male admitted on 01/09/2017 with intra-abdominal infection.  Pharmacy has been consulted for Ceftriaxone dosing. Pt presents to the ED with diffuse abdominal pain. WBC WNL.   Plan: -Ceftriaxone 2g IV q24h -Trend WBC, temp -F/U infectious work-up  Height: 5\' 9"  (175.3 cm) Weight: 160 lb (72.6 kg) IBW/kg (Calculated) : 70.7  Temp (24hrs), Avg:98.1 F (36.7 C), Min:97.9 F (36.6 C), Max:98.2 F (36.8 C)   Recent Labs Lab 01/09/17 1917  WBC 4.8  CREATININE 1.00    Estimated Creatinine Clearance: 91.3 mL/min (by C-G formula based on SCr of 1 mg/dL).    No Known Allergies   Brandon [name], Brandon [name] 01/10/2017 2:55 AM

## 2017-01-10 NOTE — Consult Note (Signed)
[facility] Consult: 8:17 AM 01/10/2017  LOS: 0 days    Referring Provider: Dr Starla Link  Primary Care Physician:  Alfonse Spruce, FNP of [facility] Primary Gastroenterologist:  unassigned     Reason for Consultation:  GI bleed, anemia.     HPI: Brandon [name] is a 46 y.o. male.  Originally from [place].  PMH polysubstance abuse.  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  Depression. Hepatitis B and C listed in notes from 2012 and patient was told that he was hepatitis B and C positive many years ago but no serologies in Epic.  Elevated Transaminases (58/72) in 03/2016.  Never had abdominal imaging.  05/2016 Hgb 15.2.  In the past patient has been prescribed Pepcid for what he says his ulcers but has never undergone any radiologic or endoscopic imaging/testing Patient is itinerant, he migrates between [place], [place] and [place].  Starting 6 days ago, on Friday he started feeling fatigue, weakness, passing liquid stool which was black in color. On a few occasions he vomited small amount of red colored emesis. This continued through yesterday when he presented to the emergency room.  He has spells of syncope.  He had pain in his mid to lower abdomen which radiated into the lower back.  He denies use of NSAIDs. In the ED BPs as low as 90s/50s.  Not tachy or bradycardic.   Hgbs 8.2.. 6.9.. 8.8 since admission. MCV 94. WBCs normal. No coags.  BUN not elevated.   AST/ALT 133/82.  Lipase normal.  Low albumin.  Normal t bili and alk phos.    ETOH level elevated.  No current tox screen.    S/p PRBC x 1.  Started on Rocephin for "intra-abdominal infection". Started on PPI and octreotide drip.     Patient drinks up to twelve, 12 oz, cans of  beer a day.  He smokes cocaine, last used this late last week.  Denies ever injecting drugs.  Past Medical History:  Diagnosis Date  . Hypertension   as seen in HPI as well.   History reviewed. No pertinent surgical history.  Prior to Admission medications   None   Scheduled Meds: . folic acid  1 mg Oral Daily  . multivitamin with minerals  1 tablet Oral Daily  . [START ON 01/13/2017] pantoprazole  40 mg Intravenous Q12H  . thiamine  100 mg Oral Daily   Or  . thiamine  100 mg Intravenous Daily   Infusions: . sodium chloride    . cefTRIAXone (ROCEPHIN)  IV    . octreotide  (SANDOSTATIN)    IV infusion 50 mcg/hr (01/10/17 0137)  . pantoprozole (PROTONIX) infusion 8 mg/hr (01/10/17 0138)   PRN Meds: acetaminophen **OR** acetaminophen, LORazepam **OR** LORazepam, ondansetron **OR** ondansetron (ZOFRAN) IV   Allergies as of 01/09/2017  . (No Known Allergies)    Family History  Problem Relation Age of Onset  . Hypertension Other     Social History   Social History  . Marital status: Single  Social History Main Topics  . Smoking status: Current Every Day Smoker    Packs/day: 0.50    Types: Cigarettes  . Smokeless tobacco: Never Used  . Alcohol use Yes     Comment: 5 drinks a day both beer and liquor  . Drug use: Yes    Frequency: 4.0 times per week    Types: Cocaine, Marijuana  . Sexual activity: Yes    REVIEW OF SYSTEMS: Constitutional: Weakness, fatigues easily.  Anorexia for many months.  Weight loss of 25 pounds in the last 6 months ENT: Occasionally sees small amounts of blood when he blows his nose. Pulm: Dyspnea on exertion since the end of last week. CV:  No palpitations, no LE edema.  GU:  No hematuria, no frequency GI: No dysphagia.  No previous history of GI bleed. Heme: Denies excessive or unusual bleeding or bruising. Transfusions: Denies previous blood transfusions. Neuro: Syncope on several occasions in recent days.  No headaches, no  peripheral tingling or numbness Derm:  No itching, no rash or sores.  Endocrine:  No sweats or chills.  No polyuria or dysuria Immunization: Did not inquire as to recent immunizations. Travel:  None beyond local counties in last few months.    PHYSICAL EXAM: Vital signs in last 24 hours: Vitals:   01/10/17 0545 01/10/17 0700  BP: 95/69 96/60  Pulse: 67 (!) 57  Resp: 12 14  Temp:    SpO2: 93% 100%   Wt Readings from Last 3 Encounters:  01/09/17 72.6 kg (160 lb)  08/28/16 72.1 kg (159 lb)  04/14/16 72.2 kg (159 lb 3.2 oz)    General: Scrawny, malnourished and somewhat ill appearing BM Head: No facial asymmetry or swelling.  No signs of head trauma. Eyes: Muddy sclera but no scleral icterus.  No conjunctival pallor.  EOMI. Ears: Hard of hearing Nose: No discharge or congestion Mouth: Poor dentition.  Many missing teeth and remainder of teeth full of caries, oral mucosa pink, moist, clear.  Tongue midline. Neck: No thyromegaly, no masses, no bruits, no JVD Lungs: Clear bilaterally.  No labored breathing or cough. Heart: RRR.  No MRG.  S1, S2 present Abdomen: Soft.  No masses or HSM.  No bruits, no hernias.  Bowel sounds active.  Not distended.  Mildly tender lower mid abdomen and around the umbilicus. Rectal: Deferred rectal exam.  This was performed by ED staff early this morning.  Stool itself not described other than to say that it was scant and there was no gross blood but specimen tested guaiac positive Musc/Skeltl: No joint swelling or gross deformity. Extremities: On his left hand, he has 2 thumbs.   Neurologic: Patient is alert.  He is oriented x 3.  He moves all 4 limbs.  No asterixis or tremor. Skin: No rashes or sores Nodes: No cervical adenopathy. Psych: Cooperative, pleasant, somewhat pressured speech.  Slightly anxious but not agitated.  LAB RESULTS:  Recent Labs  01/09/17 1917 01/10/17 0117 01/10/17 0413  WBC 4.8  --  4.2  HGB 8.2* 6.9* 8.8*  HCT 25.2*  21.2* 27.4*  PLT 176  --  149*   BMET Lab Results  Component Value Date   NA 139 01/10/2017   NA 134 (L) 01/09/2017   NA 137 06/09/2016   K 4.3 01/10/2017   K 3.7 01/09/2017   K 4.2 06/09/2016   CL 111 01/10/2017   CL 106 01/09/2017   CL 104 06/09/2016   CO2 21 (L) 01/10/2017   CO2   23 01/09/2017   CO2 24 06/09/2016   GLUCOSE 132 (H) 01/10/2017   GLUCOSE 107 (H) 01/09/2017   GLUCOSE 109 (H) 06/09/2016   BUN 10 01/10/2017   BUN 10 01/09/2017   BUN 10 06/09/2016   CREATININE 0.90 01/10/2017   CREATININE 1.00 01/09/2017   CREATININE 0.95 06/09/2016   CALCIUM 7.8 (L) 01/10/2017   CALCIUM 8.4 (L) 01/09/2017   CALCIUM 9.5 06/09/2016   LFT  Recent Labs  01/09/17 1917  PROT 6.3*  ALBUMIN 3.1*  AST 133*  ALT 82*  ALKPHOS 58  BILITOT 0.3   Lab Results  Component Value Date   INR 1.13 01/10/2017     Lipase     Component Value Date/Time   LIPASE 42 01/09/2017 1917   IMPRESSION:   *  GI bleed.  ? Upper vs lower?  *  Apparent but unproven hx of Hep B and C.  No serologies or liver imaging in records.  Transaminases elevated.    *  Anemia, normocytic.  hgb improved S/p PRBC x 1.  *  ETOH and cocaine abuse.    *  Hyperglycemia.     PLAN:     *  PT INR stat.  Hepatitis B and C serologies.  tox screen.    *  Will need EGD but need coags before hand.    *  Will need ultrasound vs CT imaging.  Start with CT.      Azucena Freed  01/10/2017, 8:17 AM Pager: [phone]    [facility] Attending   I have taken an interval history, reviewed the chart and examined the patient. I agree with the Advanced Practitioner's note, impression and recommendations.   EGD for melena The risks and benefits as well as alternatives of endoscopic procedure(s) have been discussed and reviewed. All questions answered. The patient agrees to proceed. Hepatitis serologies and cross sectional imaging for hx hepatitis/liver dz  Gatha Mayer, MD, [facility]  Gastroenterology [phone] (pager) 01/10/2017 4:23 PM

## 2017-01-10 NOTE — ED Notes (Signed)
Pt CBG 101. 

## 2017-01-11 ENCOUNTER — Inpatient Hospital Stay (HOSPITAL_COMMUNITY): Payer: Medicare (Managed Care) | Admitting: Anesthesiology

## 2017-01-11 ENCOUNTER — Encounter (HOSPITAL_COMMUNITY)
Admission: EM | Disposition: A | Discharge status: Home / Self Care | Payer: Self-pay | Source: Home / Self Care | Attending: Internal Medicine

## 2017-01-11 ENCOUNTER — Encounter (HOSPITAL_COMMUNITY): Payer: Self-pay | Admitting: *Deleted

## 2017-01-11 DIAGNOSIS — B9681 Helicobacter pylori [H. pylori] as the cause of diseases classified elsewhere: Secondary | ICD-10-CM

## 2017-01-11 DIAGNOSIS — F1920 Other psychoactive substance dependence, uncomplicated: Secondary | ICD-10-CM

## 2017-01-11 DIAGNOSIS — K3189 Other diseases of stomach and duodenum: Secondary | ICD-10-CM

## 2017-01-11 DIAGNOSIS — K2210 Ulcer of esophagus without bleeding: Secondary | ICD-10-CM

## 2017-01-11 DIAGNOSIS — K210 Gastro-esophageal reflux disease with esophagitis: Secondary | ICD-10-CM

## 2017-01-11 DIAGNOSIS — K2961 Other gastritis with bleeding: Secondary | ICD-10-CM

## 2017-01-11 HISTORY — PX: ESOPHAGOGASTRODUODENOSCOPY: SHX5428

## 2017-01-11 LAB — CBC WITH DIFFERENTIAL/PLATELET
BASOS ABS: 0 10*3/uL (ref 0.0–0.1)
Basophils Relative: 0 %
EOS PCT: 2 %
Eosinophils Absolute: 0.1 10*3/uL (ref 0.0–0.7)
HCT: 29.7 % — ABNORMAL LOW (ref 39.0–52.0)
Hemoglobin: 9.4 g/dL — ABNORMAL LOW (ref 13.0–17.0)
LYMPHS PCT: 49 %
Lymphs Abs: 2.1 10*3/uL (ref 0.7–4.0)
MCH: 29.8 pg (ref 26.0–34.0)
MCHC: 31.6 g/dL (ref 30.0–36.0)
MCV: 94.3 fL (ref 78.0–100.0)
MONO ABS: 0.4 10*3/uL (ref 0.1–1.0)
Monocytes Relative: 10 %
Neutro Abs: 1.7 10*3/uL (ref 1.7–7.7)
Neutrophils Relative %: 39 %
PLATELETS: 192 10*3/uL (ref 150–400)
RBC: 3.15 MIL/uL — ABNORMAL LOW (ref 4.22–5.81)
RDW: 17.1 % — AB (ref 11.5–15.5)
WBC: 4.3 10*3/uL (ref 4.0–10.5)

## 2017-01-11 LAB — COMPREHENSIVE METABOLIC PANEL
ALT: 68 U/L — ABNORMAL HIGH (ref 17–63)
ANION GAP: 7 (ref 5–15)
AST: 79 U/L — AB (ref 15–41)
Albumin: 2.8 g/dL — ABNORMAL LOW (ref 3.5–5.0)
Alkaline Phosphatase: 42 U/L (ref 38–126)
BUN: 5 mg/dL — ABNORMAL LOW (ref 6–20)
CHLORIDE: 104 mmol/L (ref 101–111)
CO2: 27 mmol/L (ref 22–32)
Calcium: 8.1 mg/dL — ABNORMAL LOW (ref 8.9–10.3)
Creatinine, Ser: 0.99 mg/dL (ref 0.61–1.24)
Glucose, Bld: 113 mg/dL — ABNORMAL HIGH (ref 65–99)
POTASSIUM: 3.7 mmol/L (ref 3.5–5.1)
Sodium: 138 mmol/L (ref 135–145)
Total Bilirubin: 0.4 mg/dL (ref 0.3–1.2)
Total Protein: 6.1 g/dL — ABNORMAL LOW (ref 6.5–8.1)

## 2017-01-11 LAB — URINE DRUGS OF ABUSE SCREEN W ALC, ROUTINE (REF LAB)
AMPHETAMINES, URINE: NEGATIVE ng/mL
Barbiturate, Ur: NEGATIVE ng/mL
Benzodiazepine Quant, Ur: NEGATIVE ng/mL
Cannabinoid Quant, Ur: NEGATIVE ng/mL
Cocaine (Metab.): NEGATIVE ng/mL
ETHANOL U, QUAN: NEGATIVE %
Methadone Screen, Urine: NEGATIVE ng/mL
OPIATE QUANT UR: NEGATIVE ng/mL
PHENCYCLIDINE, UR: NEGATIVE ng/mL
Propoxyphene, Urine: NEGATIVE ng/mL

## 2017-01-11 LAB — HEPATITIS C ANTIBODY

## 2017-01-11 LAB — GLUCOSE, CAPILLARY: GLUCOSE-CAPILLARY: 109 mg/dL — AB (ref 65–99)

## 2017-01-11 LAB — HEPATITIS B SURFACE ANTIGEN

## 2017-01-11 LAB — MAGNESIUM: MAGNESIUM: 2.3 mg/dL (ref 1.7–2.4)

## 2017-01-11 LAB — HEPATITIS B SURFACE AG, CONFIRM: HBSAG CONFIRMATION: POSITIVE — AB

## 2017-01-11 LAB — PROTIME-INR
INR: 1.14
PROTHROMBIN TIME: 14.5 s (ref 11.4–15.2)

## 2017-01-11 LAB — HEPATITIS B SURFACE ANTIBODY,QUALITATIVE: Hep B S Ab: NONREACTIVE

## 2017-01-11 SURGERY — EGD (ESOPHAGOGASTRODUODENOSCOPY)
Anesthesia: Monitor Anesthesia Care

## 2017-01-11 MED ORDER — LACTATED RINGERS IV SOLN
INTRAVENOUS | Status: DC | PRN
  Administered 2017-01-11: 08:00:00 via INTRAVENOUS

## 2017-01-11 MED ORDER — ONDANSETRON HCL 4 MG PO TABS: 4.0000 mg | ORAL_TABLET | Freq: Four times a day (QID) | ORAL | 0 refills | Status: AC | PRN

## 2017-01-11 MED ORDER — PANTOPRAZOLE SODIUM 40 MG PO TBEC: 40.0000 mg | DELAYED_RELEASE_TABLET | Freq: Every day | ORAL | 0 refills | Status: DC

## 2017-01-11 MED ORDER — PROPOFOL 500 MG/50ML IV EMUL
INTRAVENOUS | Status: DC | PRN
  Administered 2017-01-11: 125 ug/kg/min via INTRAVENOUS

## 2017-01-11 MED ORDER — THIAMINE HCL 100 MG PO TABS: 100.0000 mg | ORAL_TABLET | Freq: Every day | ORAL | 0 refills | Status: DC

## 2017-01-11 MED ORDER — INFLUENZA VAC SPLIT QUAD 0.5 ML IM SUSY: 0.5000 mL | PREFILLED_SYRINGE | INTRAMUSCULAR | Status: DC

## 2017-01-11 MED ORDER — INFLUENZA VAC SPLIT QUAD 0.5 ML IM SUSY
0.5000 mL | PREFILLED_SYRINGE | INTRAMUSCULAR | Status: AC
  Administered 2017-01-11: 0.5 mL via INTRAMUSCULAR
  Filled 2017-01-11: qty 0.5

## 2017-01-11 MED ORDER — PROPOFOL 10 MG/ML IV BOLUS
INTRAVENOUS | Status: DC | PRN
  Administered 2017-01-11: 40 mg via INTRAVENOUS
  Administered 2017-01-11: 20 mg via INTRAVENOUS

## 2017-01-11 MED ORDER — FOLIC ACID 1 MG PO TABS: 1.0000 mg | ORAL_TABLET | Freq: Every day | ORAL | 0 refills | Status: DC

## 2017-01-11 MED ORDER — ADULT MULTIVITAMIN W/MINERALS CH: 1.0000 | ORAL_TABLET | Freq: Every day | ORAL | 0 refills | Status: AC

## 2017-01-11 MED ORDER — PANTOPRAZOLE SODIUM 40 MG PO TBEC: 40.0000 mg | DELAYED_RELEASE_TABLET | Freq: Every day | ORAL | Status: DC

## 2017-01-11 MED ORDER — LIDOCAINE HCL (CARDIAC) 20 MG/ML IV SOLN
INTRAVENOUS | Status: DC | PRN
  Administered 2017-01-11: 100 mg via INTRATRACHEAL

## 2017-01-11 MED ORDER — BUTAMBEN-TETRACAINE-BENZOCAINE 2-2-14 % EX AERO
INHALATION_SPRAY | CUTANEOUS | Status: DC | PRN
  Administered 2017-01-11: 1 via TOPICAL

## 2017-01-11 NOTE — Care Management Note (Addendum)
Case Management Note  Patient Details  Name: Brandon [name] MRN: [id number] Date of Birth: 12/19/1970  Subjective/Objective:     Pt admitted with GI bleed - pt is now [name]/p negative GI              Action/Plan:  PTA independent from home.  Pt states [place] [facility] [place] friends in [place] and plans to return back to [place] over the weekend.  Pt [place] [place].  Per pt request; CM made appt with Dr. Christoper FabianJabob Fegal at the [facility] in [place] 11/29 at 1pm at the [facility] [address] site (pt states he knows where the location is).  Pt also has PCP  With [facility] and unit secretary will set up post discharge follow up appt. In case pt doesn't return to [name] as [place] - pt instructed to cancel appt if he does return to [facility].  Pt has active medicare and therefore medication assistance can not be provided. However CM did provide coupon for Protonix and Zofran for [employer]. CSW consulted during progression rounds for current substance abuse and possible homelessness.   NO CM needs determined prior to discharge   Expected Discharge Date:  01/11/17               Expected Discharge Plan:  Home/Self Care  In-House Referral:     Discharge planning Services  CM Consult  Post Acute Care Choice:    Choice offered to:     DME Arranged:    DME Agency:     HH Arranged:    HH Agency:     Status of Service:  Completed, signed off  If discussed at [employer] Length of Stay Meetings, dates discussed:    Additional Comments:  Brandon [name], Brandon Kissel S, RN 01/11/2017, 1:30 PM

## 2017-01-11 NOTE — Interval H&P Note (Signed)
History and Physical Interval Note:  01/11/2017 7:28 AM  Brandon [name]  has presented today for surgery, with the diagnosis of melena, hematemesis, ETOH and cocaine abuse, anemia  The various methods of treatment have been discussed with the patient and family. After consideration of risks, benefits and other options for treatment, the patient has consented to  Procedure(s): ESOPHAGOGASTRODUODENOSCOPY (EGD) (N/A) as a surgical intervention .  The patient's history has been reviewed, patient examined, no change in status, stable for surgery.  I have reviewed the patient's chart and labs.  Questions were answered to the patient's satisfaction.     [name]

## 2017-01-11 NOTE — Discharge Summary (Signed)
Physician Discharge Summary  Johaan Ryser ZOX:[id number] DOB: April 30, 1970 DOA: 01/09/2017  PCP: Lizbeth Bark, FNP  Admit date: 01/09/2017 Discharge date: 01/11/2017  Admitted From: Home Disposition:  Home  Recommendations for Outpatient Follow-up:  1. Follow up with PCP in 1 week with repeat CBC/BMP 2. Follow-up with gastroenterology/Dr. Leone Payor in 1-2 weeks 3. Comply with medications and follow-up 4. Abstain from alcohol and illicit drug including cocaine 5. Follow-up in the ED if symptoms worsen or new appear   Home Health: No Equipment/Devices: None  Discharge Condition: Guarded  CODE STATUS: Full  Diet recommendation: Heart Healthy   Brief/Interim Summary: 46 year old male with history of alcohol abuse, polysubstance abuse including cocaine abuse, hepatitis B and C with no history of any treatment presented with rectal bleeding. GI was consulted and Protonix and octreotide drips were started. Patient had EGD done today and GI has cleared the patient for discharge.  Discharge Diagnoses:  Principal Problem:   Acute GI bleeding Active Problems:   Polysubstance dependence including opioid type drug without complication, episodic abuse (HCC)   Acute blood loss anemia   GI bleed   Erosive gastritis   Erosive esophagitis  1. Acute GI bleeding -likely upper GI. Initially started on Protonix and octreotide drips. Status post upper GI endoscopy today which showed  LA grade B reflux esophagitis along with nonbleeding erosive gastropathy which was biopsied. Awaiting biopsy results. Patient has been switched to oral Protonix 40 mg daily. Patient has tolerated her diet. GI has cleared the patient with discharge. Outpatient follow-up with GI. No aspirin, ibuprofen, naproxen or other nonsteroidal anti-inflammatory drugs as per GI recommendations. Hemoglobin stable today  2. Acute blood loss anemia -status post 1 unit of packed red cells transfusion since admission. Hemoglobin  9.4 today. Outpatient follow-up with primary care provider in a week's time with repeat. 3. History of hepatitis C and B -follow up with GI as an outpatient 4. Alcohol abuse -no signs of withdrawal. Counseled patient about cessation of alcohol. Patient might benefit from outpatient resources to help; social worker consulted. Continue multivitamin, thiamine, folate on discharge 5. History of polysubstance abuse including cocaine: Consult about abstinence. Social worker consulted 6. Question of noncompliance: Patient will need close outpatient follow-up with primary care provider and GI. Social worker consulted as patient states that he is homeless. Care management team also consulted for the same.  Discharge Instructions  Discharge Instructions    Call MD for:  difficulty breathing, headache or visual disturbances    Complete by:  As directed    Call MD for:  extreme fatigue    Complete by:  As directed    Call MD for:  hives    Complete by:  As directed    Call MD for:  persistant dizziness or light-headedness    Complete by:  As directed    Call MD for:  persistant nausea and vomiting    Complete by:  As directed    Call MD for:  severe uncontrolled pain    Complete by:  As directed    Call MD for:  temperature >100.4    Complete by:  As directed    Diet - low sodium heart healthy    Complete by:  As directed    Discharge instructions    Complete by:  As directed    Comply with medications and followup Abstain from alcohol or illicit drug including cocain   Increase activity slowly    Complete by:  As directed  Allergies as of 01/11/2017   No Known Allergies     Medication List    STOP taking these medications   ibuprofen 600 MG tablet Commonly known as:  ADVIL,MOTRIN   oxyCODONE-acetaminophen 5-325 MG tablet Commonly known as:  PERCOCET/ROXICET   oxymetazoline 0.05 % nasal spray Commonly known as:  AFRIN NASAL SPRAY   terbinafine 1 % cream Commonly known as:   LAMISIL AT ATHLETES FOOT     TAKE these medications   albuterol 108 (90 Base) MCG/ACT inhaler Commonly known as:  PROVENTIL HFA;VENTOLIN HFA Inhale 2 puffs into the lungs every 6 (six) hours as needed for wheezing or shortness of breath.   folic acid 1 MG tablet Commonly known as:  FOLVITE Take 1 tablet (1 mg total) by mouth daily.   multivitamin with minerals Tabs tablet Take 1 tablet by mouth daily.   ondansetron 4 MG tablet Commonly known as:  ZOFRAN Take 1 tablet (4 mg total) by mouth every 6 (six) hours as needed for nausea.   pantoprazole 40 MG tablet Commonly known as:  PROTONIX Take 1 tablet (40 mg total) by mouth daily before breakfast.   thiamine 100 MG tablet Take 1 tablet (100 mg total) by mouth daily.      Follow-up Information    Lizbeth Bark, FNP. Schedule an appointment as soon as possible for a visit in 1 week(s).   Specialty:  Family Medicine Why:  with repeat CBC/BMP Contact information: [address] [place] [address] [phone]        Iva Boop, MD. Schedule an appointment as soon as possible for a visit in 1 week(s).   Specialty:  Gastroenterology Contact information: [address] [address] [phone]          No Known Allergies  Consultations:  GI   Procedures/Studies: Ct Abdomen Pelvis W Contrast  Result Date: 01/10/2017 CLINICAL DATA:  Lower abdominal and back pain. Intermittent melena and hematemesis. EXAM: CT ABDOMEN AND PELVIS WITH CONTRAST TECHNIQUE: Multidetector CT imaging of the abdomen and pelvis was performed using the standard protocol following bolus administration of intravenous contrast. CONTRAST:  100 mL ISOVUE-300 IOPAMIDOL (ISOVUE-300) INJECTION 61% COMPARISON:  None. FINDINGS: Lower Chest: Tiny bilateral pleural effusions and mild dependent atelectasis. Right lower lung pulmonary nodule measuring at least 4 mm seen on image 1/4, possibly incompletely visualized.  Hepatobiliary: Liver has normal gross morphology. No hepatic masses identified. Gallbladder is unremarkable. Pancreas:  No mass or inflammatory changes. Spleen: Within normal limits in size and appearance. Adrenals/Urinary Tract: No masses identified. Probable tiny sub-cm left renal cyst. No evidence of hydronephrosis. Stomach/Bowel: Tiny hiatal hernia seen with mild wall thickening involving distal esophagus and GE junction. No evidence of distal esophageal dilatation. No small or large bowel abnormality seen. Although the appendix is not directly visualized, no inflammatory process seen in region of the cecum or elsewhere. Vascular/Lymphatic: No pathologically enlarged lymph nodes. No abdominal aortic aneurysm. Reproductive:  No mass or other significant abnormality. Other:  None. Musculoskeletal:  No suspicious bone lesions identified. IMPRESSION: Tiny hiatal hernia with mild wall thickening involving the distal esophagus and GE junction. This is suspicious for esophagitis, with esophageal carcinoma considered much less likely. Consider upper endoscopy for further evaluation . Tiny bilateral pleural effusions. Indeterminate right lower lung pulmonary nodule measuring at least 4 mm, possibly incompletely visualized on this study. Chest CT without contrast is recommended for further evaluation. Electronically Signed   By: Myles Rosenthal M.D.   On:  01/10/2017 11:48    EGD on 01/11/2017 Impression:               - LA Grade B reflux esophagitis.                           - Non-bleeding erosive gastropathy. Biopsied.                           - The examination was otherwise normal.  Subjective: Patient seen and examined at bedside. He complains of some epigastric pain. No overnight black or bloody bowel movement. No overnight fever or vomiting.  Discharge Exam: Vitals:   01/11/17 0800 01/11/17 0826  BP: (!) 86/57 103/63  Pulse: 70 (!) 54  Resp: 11 13  Temp:  98.1 F (36.7 C)  SpO2: 99% 100%   Vitals:    01/11/17 0702 01/11/17 0754 01/11/17 0800 01/11/17 0826  BP: (!) 103/39 (!) 99/57 (!) 86/57 103/63  Pulse: (!) 55 78 70 (!) 54  Resp: 10  11 13   Temp: 98.4 F (36.9 C) 98.2 F (36.8 C)  98.1 F (36.7 C)  TempSrc: Oral Oral  Oral  SpO2: 98% 100% 99% 100%  Weight:      Height:        General: Pt looks older than stated age and unkempt. No acute distress Cardiovascular: Rate controlled, S1/S2 + Respiratory: Bilateral decreased breath sounds at bases Abdominal: Soft, mild epigastric tenderness , ND, bowel sounds + Extremities: no edema, no cyanosis    The results of significant diagnostics from this hospitalization (including imaging, microbiology, ancillary and laboratory) are listed below for reference.     Microbiology: Recent Results (from the past 240 hour(s))  MRSA PCR Screening     Status: None   Collection Time: 01/10/17  5:40 PM  Result Value Ref Range Status   MRSA by PCR NEGATIVE NEGATIVE Final    Comment:        The GeneXpert MRSA Assay (FDA approved for NASAL specimens only), is one component of a comprehensive MRSA colonization surveillance program. It is not intended to diagnose MRSA infection nor to guide or monitor treatment for MRSA infections.      Labs: BNP (last 3 results) No results for input(s): BNP in the last 8760 hours. Basic Metabolic Panel:  Recent Labs Lab 01/09/17 1917 01/10/17 0413 01/11/17 0313  NA 134* 139 138  K 3.7 4.3 3.7  CL 106 111 104  CO2 23 21* 27  GLUCOSE 107* 132* 113*  BUN 10 10 5*  CREATININE 1.00 0.90 0.99  CALCIUM 8.4* 7.8* 8.1*  MG  --   --  2.3   Liver Function Tests:  Recent Labs Lab 01/09/17 1917 01/11/17 0313  AST 133* 79*  ALT 82* 68*  ALKPHOS 58 42  BILITOT 0.3 0.4  PROT 6.3* 6.1*  ALBUMIN 3.1* 2.8*    Recent Labs Lab 01/09/17 1917  LIPASE 42   No results for input(s): AMMONIA in the last 168 hours. CBC:  Recent Labs Lab 01/09/17 1917 01/10/17 0117 01/10/17 0413  01/10/17 0800 01/11/17 0313  WBC 4.8  --  4.2 4.7 4.3  NEUTROABS  --   --   --   --  1.7  HGB 8.2* 6.9* 8.8* 8.9* 9.4*  HCT 25.2* 21.2* 27.4* 27.9* 29.7*  MCV 95.1  --  94.2 94.3 94.3  PLT 176  --  149* 160 192  Cardiac Enzymes: No results for input(s): CKTOTAL, CKMB, CKMBINDEX, TROPONINI in the last 168 hours. BNP: Invalid input(s): POCBNP CBG:  Recent Labs Lab 01/10/17 0800 01/10/17 1608 01/10/17 2344 01/11/17 0836  GLUCAP 121* 101* 124* 109*   D-Dimer No results for input(s): DDIMER in the last 72 hours. Hgb A1c No results for input(s): HGBA1C in the last 72 hours. Lipid Profile No results for input(s): CHOL, HDL, LDLCALC, TRIG, CHOLHDL, LDLDIRECT in the last 72 hours. Thyroid function studies No results for input(s): TSH, T4TOTAL, T3FREE, THYROIDAB in the last 72 hours.  Invalid input(s): FREET3 Anemia work up No results for input(s): VITAMINB12, FOLATE, FERRITIN, TIBC, IRON, RETICCTPCT in the last 72 hours. Urinalysis    Component Value Date/Time   COLORURINE YELLOW 01/09/2017 1924   APPEARANCEUR CLEAR 01/09/2017 1924   LABSPEC 1.025 01/09/2017 1924   PHURINE 5.0 01/09/2017 1924   GLUCOSEU NEGATIVE 01/09/2017 1924   HGBUR NEGATIVE 01/09/2017 1924   BILIRUBINUR NEGATIVE 01/09/2017 1924   KETONESUR NEGATIVE 01/09/2017 1924   PROTEINUR NEGATIVE 01/09/2017 1924   UROBILINOGEN 0.2 01/30/2010 0832   NITRITE NEGATIVE 01/09/2017 1924   LEUKOCYTESUR NEGATIVE 01/09/2017 1924   Sepsis Labs Invalid input(s): PROCALCITONIN,  WBC,  LACTICIDVEN Microbiology Recent Results (from the past 240 hour(s))  MRSA PCR Screening     Status: None   Collection Time: 01/10/17  5:40 PM  Result Value Ref Range Status   MRSA by PCR NEGATIVE NEGATIVE Final    Comment:        The GeneXpert MRSA Assay (FDA approved for NASAL specimens only), is one component of a comprehensive MRSA colonization surveillance program. It is not intended to diagnose MRSA infection nor to guide  or monitor treatment for MRSA infections.      Time coordinating discharge: 35 minutes  SIGNED:   Glade Lloyd, MD  [facility] 01/11/2017, 11:41 AM Pager: [phone]  If 7PM-7AM, please contact night-coverage [email or web address] Password [email or web address]

## 2017-01-11 NOTE — Discharge Instructions (Signed)
Gastrointestinal Bleeding °Gastrointestinal (GI) bleeding is bleeding somewhere along the digestive tract, between the mouth and anus. This can be caused by various problems. The severity of these problems can range from mild to serious or even life-threatening. If you have GI bleeding, you may find blood in your stools (feces), you may have black stools, or you may vomit blood. If there is a lot of bleeding, you may need to stay in the hospital. °What are the causes? °This condition may be caused by: °· Esophagitis. This is inflammation, irritation, or swelling of the esophagus. °· Hemorrhoids. These are swollen veins in the rectum. °· Anal fissures. These are areas of painful tearing that are often caused by passing hard stool. °· Diverticulosis. These are pouches that form on the colon over time, with age, and may bleed a lot. °· Diverticulitis. This is inflammation in areas with diverticulosis. It can cause pain, fever, and bloody stools, although bleeding may be mild. °· Polyps and cancer. Colon cancer often starts out as precancerous polyps. °· Gastritis and ulcers. With these, bleeding may come from the upper GI tract, near the stomach. ° °What are the signs or symptoms? °Symptoms of this condition may include: °· Bright red blood in your vomit, or vomit that looks like coffee grounds. °· Bloody, black, or tarry stools. °? Bleeding from the lower GI tract will usually cause red or maroon blood in the stools. °? Bleeding from the upper GI tract may cause black, tarry, often bad-smelling stools. °? In certain cases, if the bleeding is fast enough, the stools may be red. °· Pain or cramping in the abdomen. ° °How is this diagnosed? °This condition may be diagnosed based on: °· Medical history and physical exam. °· Various tests, such as: °? Blood tests. °? X-rays and other imaging tests. °? Esophagogastroduodenoscopy (EGD). In this test, a flexible, lighted tube is used to look at your esophagus, stomach, and  small intestine. °? Colonoscopy. In this test, a flexible, lighted tube is used to look at your colon. ° °How is this treated? °Treatment for this condition depends on the cause of the bleeding. For example: °· For bleeding from the esophagus, stomach, small intestine, or colon, the health care provider doing your EGD or colonoscopy may be able to stop the bleeding as part of the procedure. °· Inflammation or infection of the colon can be treated with medicines. °· Certain rectal problems can be treated with creams, suppositories, or warm baths. °· Surgery is sometimes needed. °· Blood transfusions are sometimes needed if a lot of blood has been lost. ° °If bleeding is slow, you may be allowed to go home. If there is a lot of bleeding, you will need to stay in the hospital for observation. °Follow these instructions at home: °· Take over-the-counter and prescription medicines only as told by your health care provider. °· Eat foods that are high in fiber. This will help to keep your stools soft. These foods include whole grains, legumes, fruits, and vegetables. Eating 1-3 prunes each day works well for many people. °· Drink enough fluid to keep your urine clear or pale yellow. °· Keep all follow-up visits as told by your health care provider. This is important. °Contact a health care provider if: °· Your symptoms do not improve. °Get help right away if: °· Your bleeding increases. °· You feel light-headed or you faint. °· You feel weak. °· You have severe cramps in your back or abdomen. °· You pass large blood clots in your stool. °·   Your symptoms are getting worse. °This information is not intended to replace advice given to you by your health care provider. Make sure you discuss any questions you have with your health care provider. °Document Released: 03/03/2000 Document Revised: 08/04/2015 Document Reviewed: 08/24/2014 °Elsevier Interactive Patient Education © 2018 [employer] ° °

## 2017-01-11 NOTE — Anesthesia Procedure Notes (Signed)
Procedure Name: MAC Date/Time: 01/11/2017 7:36 AM Performed by: Lance Coon Pre-anesthesia Checklist: Patient identified, Emergency Drugs available, Suction available, Patient being monitored and Timeout performed Patient Re-evaluated:Patient Re-evaluated prior to induction Oxygen Delivery Method: Nasal cannula

## 2017-01-11 NOTE — Op Note (Signed)
[facility] Patient Name: Brandon [name] Procedure Date : 01/11/2017 MRN: [id number] Attending MD: Iva Boop , MD Date of Birth: August 20, 1970 CSN: [id number] Age: 46 Admit Type: Inpatient Procedure:                Upper GI endoscopy Indications:              Melena Providers:                Iva Boop, MD, Dwain Sarna, RN, Zoila Shutter, Technician, Rosiland Oz, CRNA Referring MD:              Medicines:                Propofol per Anesthesia, Monitored Anesthesia Care Complications:            No immediate complications. Estimated Blood Loss:     Estimated blood loss was minimal. Procedure:                Pre-Anesthesia Assessment:                           - Prior to the procedure, a History and Physical                            was performed, and patient medications and                            allergies were reviewed. The patient's tolerance of                            previous anesthesia was also reviewed. The risks                            and benefits of the procedure and the sedation                            options and risks were discussed with the patient.                            All questions were answered, and informed consent                            was obtained. Prior Anticoagulants: The patient has                            taken no previous anticoagulant or antiplatelet                            agents. ASA Grade Assessment: III - A patient with                            severe systemic disease. After reviewing the risks  and benefits, the patient was deemed in                            satisfactory condition to undergo the procedure.                           After obtaining informed consent, the endoscope was                            passed under direct vision. Throughout the                            procedure, the patient's blood pressure, pulse, and                  oxygen saturations were monitored continuously. The                            EG-[id number] ([id number]) scope was introduced through the                            mouth, and advanced to the second part of duodenum.                            The upper GI endoscopy was accomplished without                            difficulty. The patient tolerated the procedure                            well. Scope In: Scope Out: Findings:      LA Grade B (one or more mucosal breaks greater than 5 mm, not extending       between the tops of two mucosal folds) esophagitis with no bleeding was       found in the distal esophagus.      Multiple dispersed, medium non-bleeding erosions were found in the       gastric antrum. There were stigmata of recent bleeding. Biopsies were       taken with a cold forceps for Helicobacter pylori testing using CLOtest.       Verification of patient identification for the specimen was done.       Estimated blood loss was minimal.      The cardia and gastric fundus were normal on retroflexion.      The exam was otherwise without abnormality. Impression:               - LA Grade B reflux esophagitis.                           - Non-bleeding erosive gastropathy. Biopsied.                           - The examination was otherwise normal. Moderate Sedation:      Please see anesthesia notes, moderate sedation not given Recommendation:           - Resume previous diet.                           -  Continue present medications.                           - Await pathology results.                           - No aspirin, ibuprofen, naproxen, or other                            non-steroidal anti-inflammatory drugs.                           - Follow an antireflux regimen.                           - Use Protonix (pantoprazole) 40 mg PO daily.                           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 - no                             cirrhosis on CT and no signs portal htn on EGD                           I will f/u CLO test, HBSAg is pending also                           Home soon - could go today I think                           I will f/u CLO test, HCV RNA and arrange GI f/u                           Suggest referral to [facility] fopr HCV if RNA + and if                            HBSAg + Procedure Code(s):        --- Professional ---                           [phone], Esophagogastroduodenoscopy, flexible,                            transoral; with biopsy, single or multiple Diagnosis Code(s):        --- Professional ---                           K21.0, Gastro-esophageal reflux disease with                            esophagitis  K31.89, Other diseases of stomach and duodenum                           K92.1, Melena (includes Hematochezia) CPT copyright 2016 American Medical Association. All rights reserved. The codes documented in this report are preliminary and upon coder review may  be revised to meet current compliance requirements. Iva Boop, MD 01/11/2017 8:04:15 AM This report has been signed electronically. Number of Addenda: 0

## 2017-01-11 NOTE — Transfer of Care (Signed)
Immediate Anesthesia Transfer of Care Note  Patient: Brandon [name]  Procedure(s) Performed: ESOPHAGOGASTRODUODENOSCOPY (EGD) (N/A )  Patient Location: PACU  Anesthesia Type:MAC  Level of Consciousness: awake, alert  and patient cooperative  Airway & Oxygen Therapy: Patient Spontanous Breathing  Post-op Assessment: Report given to RN and Post -op Vital signs reviewed and stable  Post vital signs: Reviewed and stable  Last Vitals:  Vitals:   01/11/17 0702 01/11/17 0754  BP: (!) 103/39 (!) 99/57  Pulse: (!) 55 78  Resp: 10   Temp: 36.9 C 36.8 C  SpO2: 98% 100%    Last Pain:  Vitals:   01/11/17 0754  TempSrc: Oral  PainSc:       Patients Stated Pain Goal: 0 (28/41/32 4401)  Complications: No apparent anesthesia complications

## 2017-01-11 NOTE — H&P (View-Only) (Signed)
[facility] Consult: 8:17 AM 01/10/2017  LOS: 0 days    Referring Provider: Dr Starla Link  Primary Care Physician:  Alfonse Spruce, FNP [facility] Primary Gastroenterologist:  unassigned     Reason for Consultation:  GI bleed, anemia.     HPI: Brandon [name] is a 46 y.o. male.  Originally from [place].  PMH polysubstance abuse.  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  Depression. Hepatitis B and C listed in notes from 2012 and patient was told that he was hepatitis B and C positive many years ago but no serologies in Epic.  Elevated Transaminases (58/72) in 03/2016.  Never had abdominal imaging.  05/2016 Hgb 15.2.  In the past patient has been prescribed Pepcid for what he says his ulcers but has never undergone any radiologic or endoscopic imaging/testing Patient is itinerant, he migrates between [place], [place] and [place].  Starting 6 days ago, on Friday he started feeling fatigue, weakness, passing liquid stool which was black in color. On a few occasions he vomited small amount of red colored emesis. This continued through yesterday when he presented to the emergency room.  He has spells of syncope.  He had pain in his mid to lower abdomen which radiated into the lower back.  He denies use of NSAIDs. In the ED BPs as low as 90s/50s.  Not tachy or bradycardic.   Hgbs 8.2.. 6.9.. 8.8 since admission. MCV 94. WBCs normal. No coags.  BUN not elevated.   AST/ALT 133/82.  Lipase normal.  Low albumin.  Normal t bili and alk phos.    ETOH level elevated.  No current tox screen.    S/p PRBC x 1.  Started on Rocephin for "intra-abdominal infection". Started on PPI and octreotide drip.     Patient drinks up to twelve, 12 oz, cans of  beer a day.  He smokes cocaine, last used this late last week.  Denies ever injecting drugs.  Past Medical History:  Diagnosis Date  . Hypertension   as seen in HPI as well.   History reviewed. No pertinent surgical history.  Prior to Admission medications   None   Scheduled Meds: . folic acid  1 mg Oral Daily  . multivitamin with minerals  1 tablet Oral Daily  . [START ON 01/13/2017] pantoprazole  40 mg Intravenous Q12H  . thiamine  100 mg Oral Daily   Or  . thiamine  100 mg Intravenous Daily   Infusions: . sodium chloride    . cefTRIAXone (ROCEPHIN)  IV    . octreotide  (SANDOSTATIN)    IV infusion 50 mcg/hr (01/10/17 0137)  . pantoprozole (PROTONIX) infusion 8 mg/hr (01/10/17 0138)   PRN Meds: acetaminophen **OR** acetaminophen, LORazepam **OR** LORazepam, ondansetron **OR** ondansetron (ZOFRAN) IV   Allergies as of 01/09/2017  . (No Known Allergies)    Family History  Problem Relation Age of Onset  . Hypertension Other     Social History   Social History  . Marital status: Single  Social History Main Topics  . Smoking status: Current Every Day Smoker    Packs/day: 0.50    Types: Cigarettes  . Smokeless tobacco: Never Used  . Alcohol use Yes     Comment: 5 drinks a day both beer and liquor  . Drug use: Yes    Frequency: 4.0 times per week    Types: Cocaine, Marijuana  . Sexual activity: Yes    REVIEW OF SYSTEMS: Constitutional: Weakness, fatigues easily.  Anorexia for many months.  Weight loss of 25 pounds in the last 6 months ENT: Occasionally sees small amounts of blood when he blows his nose. Pulm: Dyspnea on exertion since the end of last week. CV:  No palpitations, no LE edema.  GU:  No hematuria, no frequency GI: No dysphagia.  No previous history of GI bleed. Heme: Denies excessive or unusual bleeding or bruising. Transfusions: Denies previous blood transfusions. Neuro: Syncope on several occasions in recent days.  No headaches, no  peripheral tingling or numbness Derm:  No itching, no rash or sores.  Endocrine:  No sweats or chills.  No polyuria or dysuria Immunization: Did not inquire as to recent immunizations. Travel:  None beyond local counties in last few months.    PHYSICAL EXAM: Vital signs in last 24 hours: Vitals:   01/10/17 0545 01/10/17 0700  BP: 95/69 96/60  Pulse: 67 (!) 57  Resp: 12 14  Temp:    SpO2: 93% 100%   Wt Readings from Last 3 Encounters:  01/09/17 72.6 kg (160 lb)  08/28/16 72.1 kg (159 lb)  04/14/16 72.2 kg (159 lb 3.2 oz)    General: Scrawny, malnourished and somewhat ill appearing BM Head: No facial asymmetry or swelling.  No signs of head trauma. Eyes: Muddy sclera but no scleral icterus.  No conjunctival pallor.  EOMI. Ears: Hard of hearing Nose: No discharge or congestion Mouth: Poor dentition.  Many missing teeth and remainder of teeth full of caries, oral mucosa pink, moist, clear.  Tongue midline. Neck: No thyromegaly, no masses, no bruits, no JVD Lungs: Clear bilaterally.  No labored breathing or cough. Heart: RRR.  No MRG.  S1, S2 present Abdomen: Soft.  No masses or HSM.  No bruits, no hernias.  Bowel sounds active.  Not distended.  Mildly tender lower mid abdomen and around the umbilicus. Rectal: Deferred rectal exam.  This was performed by ED staff early this morning.  Stool itself not described other than to say that it was scant and there was no gross blood but specimen tested guaiac positive Musc/Skeltl: No joint swelling or gross deformity. Extremities: On his left hand, he has 2 thumbs.   Neurologic: Patient is alert.  He is oriented x 3.  He moves all 4 limbs.  No asterixis or tremor. Skin: No rashes or sores Nodes: No cervical adenopathy. Psych: Cooperative, pleasant, somewhat pressured speech.  Slightly anxious but not agitated.  LAB RESULTS:  Recent Labs  01/09/17 1917 01/10/17 0117 01/10/17 0413  WBC 4.8  --  4.2  HGB 8.2* 6.9* 8.8*  HCT 25.2*  21.2* 27.4*  PLT 176  --  149*   BMET Lab Results  Component Value Date   NA 139 01/10/2017   NA 134 (L) 01/09/2017   NA 137 06/09/2016   K 4.3 01/10/2017   K 3.7 01/09/2017   K 4.2 06/09/2016   CL 111 01/10/2017   CL 106 01/09/2017   CL 104 06/09/2016   CO2 21 (L) 01/10/2017   CO2  23 01/09/2017   CO2 24 06/09/2016   GLUCOSE 132 (H) 01/10/2017   GLUCOSE 107 (H) 01/09/2017   GLUCOSE 109 (H) 06/09/2016   BUN 10 01/10/2017   BUN 10 01/09/2017   BUN 10 06/09/2016   CREATININE 0.90 01/10/2017   CREATININE 1.00 01/09/2017   CREATININE 0.95 06/09/2016   CALCIUM 7.8 (L) 01/10/2017   CALCIUM 8.4 (L) 01/09/2017   CALCIUM 9.5 06/09/2016   LFT  Recent Labs  01/09/17 1917  PROT 6.3*  ALBUMIN 3.1*  AST 133*  ALT 82*  ALKPHOS 58  BILITOT 0.3   Lab Results  Component Value Date   INR 1.13 01/10/2017     Lipase     Component Value Date/Time   LIPASE 42 01/09/2017 1917   IMPRESSION:   *  GI bleed.  ? Upper vs lower?  *  Apparent but unproven hx of Hep B and C.  No serologies or liver imaging in records.  Transaminases elevated.    *  Anemia, normocytic.  hgb improved S/p PRBC x 1.  *  ETOH and cocaine abuse.    *  Hyperglycemia.     PLAN:     *  PT INR stat.  Hepatitis B and C serologies.  tox screen.    *  Will need EGD but need coags before hand.    *  Will need ultrasound vs CT imaging.  Start with CT.      Azucena Freed  01/10/2017, 8:17 AM Pager: [phone]    [facility] Attending   I have taken an interval history, reviewed the chart and examined the patient. I agree with the Advanced Practitioner's note, impression and recommendations.   EGD for melena The risks and benefits as well as alternatives of endoscopic procedure(s) have been discussed and reviewed. All questions answered. The patient agrees to proceed. Hepatitis serologies and cross sectional imaging for hx hepatitis/liver dz  Gatha Mayer, MD, [facility]  Gastroenterology [phone] (pager) 01/10/2017 4:23 PM

## 2017-01-11 NOTE — Progress Notes (Signed)
  I spoke to the patient after the procedure.  He only had propofol so his memory should have been intact but there are major social issues with this man I think he is probably homeless moving from friend to friend.  I did my best to explain that he needs regular medical care.  I suspect his HCV RNA will be positive if he should have treatment for that.  She takes a PPI chronically.  He reports not having a cell phone so follow-up after hospitalization may be difficult.  If Child psychotherapistsocial worker or other staff find contact information that would help me please let me know.  Staff message through epic would be a good way.  Iva Booparl E. Gessner, MD, [facility] [phone] (pager) 01/11/2017 8:43 AM

## 2017-01-11 NOTE — Clinical Social Work Note (Signed)
Clinical Social Work Assessment  Patient Details  Name: Brandon [name] MRN: [id number] Date of Birth: 1970/10/16  Date of referral:  01/11/17               Reason for consult:  Housing Concerns/Homelessness                Permission sought to share information with:  Chartered certified accountant granted to share information::     Name::        Agency::  [facility]  Relationship::     Contact Information:     Housing/Transportation Living arrangements for the past 2 months:  Homeless Source of Information:  Patient, Medical Team Patient Interpreter Needed:  None Criminal Activity/Legal Involvement Pertinent to Current Situation/Hospitalization:  No - Comment as needed Significant Relationships:  Friend, Siblings Lives with:  Self Do you feel safe going back to the place where you live?  No Need for family participation in patient care:  Yes (Comment)  Care giving concerns:  Homelessness   Social Worker assessment / plan:  CSW met with patient. No supports at bedside. CSW introduced role and inquired about needs. Patient confirmed he is homeless. He travels between [place], [place], and [place]. Patient described his history of cycling through residential substance abuse treatment programs in [place] and [place] will not have any beds until Tuesday. CSW left voicemail for [employer]. CSW provided shelter, community, food, and substance abuse resources. [facility] does not do inpatient detox. CSW provided patient with two bus passes. No further concerns. CSW signing off as social work intervention is no longer needed.  Employment status:  Disabled (Comment on whether or not currently receiving Disability) Insurance information:  Medicare PT Recommendations:  Not assessed at this time Information / Referral to community resources:  Residential Substance Abuse Treatment Options, Outpatient Substance Abuse Treatment Options, Shelter, Other (Comment  Required) (Community and food resources.)  Patient/Family's Response to care:  Patient agreeable to receiving resources. Patient's brother is supportive and involved in patient's care but lives in [place]. Patient appreciated social work intervention.  Patient/Family's Understanding of and Emotional Response to Diagnosis, Current Treatment, and Prognosis:  Patient has a good understanding of the reason for admission and social work consult. Patient appears happy with hospital care.  Emotional Assessment Appearance:  Appears stated age Attitude/Demeanor/Rapport:  Other (Pleasant) Affect (typically observed):  Accepting, Appropriate, Calm, Pleasant Orientation:  Oriented to Self, Oriented to Place, Oriented to  Time, Oriented to Situation Alcohol / Substance use:  Illicit Drugs Psych involvement (Current and /or in the community):  No (Comment)  Discharge Needs  Concerns to be addressed:  Care Coordination Readmission within the last 30 days:  No Current discharge risk:  Substance Abuse, Homeless Barriers to Discharge:  No Barriers Identified   Candie Chroman, LCSW 01/11/2017, 2:24 PM

## 2017-01-11 NOTE — Plan of Care (Signed)
Problem: Safety: Goal: Ability to remain free from injury will improve Outcome: Progressing Pt compliant with calling for assistance when needed.

## 2017-01-11 NOTE — Anesthesia Postprocedure Evaluation (Signed)
Anesthesia Post Note  Patient: Cordarro Welsch  Procedure(s) Performed: ESOPHAGOGASTRODUODENOSCOPY (EGD) (N/A )     Patient location during evaluation: Endoscopy Anesthesia Type: MAC Level of consciousness: awake and alert Pain management: pain level controlled Vital Signs Assessment: post-procedure vital signs reviewed and stable Respiratory status: spontaneous breathing, nonlabored ventilation, respiratory function stable and patient connected to nasal cannula oxygen Cardiovascular status: stable and blood pressure returned to baseline Postop Assessment: no apparent nausea or vomiting Anesthetic complications: no    Last Vitals:  Vitals:   01/11/17 0826 01/11/17 1148  BP: 103/63 120/63  Pulse: (!) 54   Resp: 13   Temp: 36.7 C 36.9 C  SpO2: 100%     Last Pain:  Vitals:   01/11/17 1148  TempSrc: Oral  PainSc:                  Montez Hageman

## 2017-01-11 NOTE — Anesthesia Preprocedure Evaluation (Signed)
Anesthesia Evaluation  Patient identified by MRN, date of birth, ID band Patient awake    Reviewed: Allergy & Precautions, NPO status , Patient's Chart, lab work & pertinent test results  Airway Mallampati: II  TM Distance: >3 FB Neck ROM: Full    Dental no notable dental hx. (+) Poor Dentition, Loose, Chipped, Missing   Pulmonary neg pulmonary ROS, Current Smoker,    Pulmonary exam normal breath sounds clear to auscultation       Cardiovascular hypertension, Normal cardiovascular exam Rhythm:Regular Rate:Normal     Neuro/Psych negative neurological ROS  negative psych ROS   GI/Hepatic negative GI ROS, (+)     substance abuse  cocaine use,   Endo/Other  negative endocrine ROS  Renal/GU negative Renal ROS  negative genitourinary   Musculoskeletal negative musculoskeletal ROS (+)   Abdominal   Peds negative pediatric ROS (+)  Hematology  (+) anemia ,   Anesthesia Other Findings   Reproductive/Obstetrics negative OB ROS                             Anesthesia Physical Anesthesia Plan  ASA: III  Anesthesia Plan: MAC   Post-op Pain Management:    Induction:   PONV Risk Score and Plan: 0 and Treatment may vary due to age or medical condition  Airway Management Planned: Nasal Cannula  Additional Equipment:   Intra-op Plan:   Post-operative Plan:   Informed Consent: I have reviewed the patients History and Physical, chart, labs and discussed the procedure including the risks, benefits and alternatives for the proposed anesthesia with the patient or authorized representative who has indicated his/her understanding and acceptance.   Dental advisory given  Plan Discussed with: CRNA  Anesthesia Plan Comments:         Anesthesia Quick Evaluation

## 2017-01-11 NOTE — Progress Notes (Signed)
Patient discharged via public transportation, discharge instructions and medication prescriptions given and reviewed, IV's removed. Patient wheeled out by NT with belongings.

## 2017-01-12 LAB — TYPE AND SCREEN
ABO/RH(D): O POS
Antibody Screen: NEGATIVE
UNIT DIVISION: 0
Unit division: 0
Unit division: 0

## 2017-01-12 LAB — BPAM RBC
BLOOD PRODUCT EXPIRATION DATE: 201811192359
Blood Product Expiration Date: 201811152359
Blood Product Expiration Date: 201811192359
ISSUE DATE / TIME: 201810240200
UNIT TYPE AND RH: 5100
UNIT TYPE AND RH: 5100
UNIT TYPE AND RH: 5100

## 2017-01-12 LAB — CLOTEST (H. PYLORI), BIOPSY: HELICOBACTER SCREEN: NEGATIVE

## 2017-01-12 MED FILL — TAB-A-VITE TABLET: 100 days supply | Qty: 100 | Fill #0

## 2017-01-12 MED FILL — ONDANSETRON HCL 4 MG TABLET: 4 | 5 days supply | Qty: 20 | Fill #0

## 2017-01-12 MED FILL — FOLIC ACID 1 MG TABLET: 1 | 30 days supply | Qty: 30 | Fill #0

## 2017-01-12 MED FILL — PANTOPRAZOLE SOD DR 40 MG T: 40 | 30 days supply | Qty: 30 | Fill #0

## 2017-01-12 MED FILL — VITAMIN B-1 100 MG TABLET: 100 | 100 days supply | Qty: 100 | Fill #0

## 2017-01-13 ENCOUNTER — Encounter (HOSPITAL_COMMUNITY): Payer: Self-pay | Admitting: Internal Medicine

## 2017-01-15 ENCOUNTER — Other Ambulatory Visit: Payer: Self-pay

## 2017-01-15 ENCOUNTER — Encounter: Payer: Self-pay | Admitting: Internal Medicine

## 2017-01-15 DIAGNOSIS — B1910 Unspecified viral hepatitis B without hepatic coma: Secondary | ICD-10-CM

## 2017-01-15 DIAGNOSIS — K7460 Unspecified cirrhosis of liver: Secondary | ICD-10-CM

## 2017-01-15 DIAGNOSIS — B181 Chronic viral hepatitis B without delta-agent: Secondary | ICD-10-CM

## 2017-01-15 DIAGNOSIS — B182 Chronic viral hepatitis C: Secondary | ICD-10-CM

## 2017-01-15 HISTORY — DX: Chronic viral hepatitis C: B18.2

## 2017-01-15 HISTORY — DX: Chronic viral hepatitis B without delta-agent: B18.1

## 2017-01-15 LAB — HCV RNA QUANT RFLX ULTRA OR GENOTYP
HCV RNA Qnt(log copy/mL): 4.684 log10 IU/mL
HEPATITIS C QUANTITATION: 48300 [IU]/mL

## 2017-01-15 LAB — HEPATITIS C GENOTYPE

## 2017-01-15 NOTE — Progress Notes (Signed)
He has hepatits B and C  Please see if you can contact him by phone - he is homeless and not sure has an active cell phone He is supposed to see Victorino DikeJennifer 11/5  Going to need referral to [facility] for HBV and HCV  If we can get him in he needs Hep B DNA, Hep B e ag and e Ab   Explain very important to get treatment for these infections and no alcohol  Sending My Chart message also

## 2017-01-19 NOTE — Congregational Nurse Program (Signed)
Congregational Nurse Program Note  Date of Encounter: 01/12/2017  Past Medical History: Past Medical History:  Diagnosis Date  . Chronic hepatitis B ([place]) 01/15/2017  . Erosive esophagitis   . Erosive gastritis with hemorrhage   . Hepatitis C, chronic ([place]) Genotype 1a 01/15/2017  . Hypertension     Encounter Details:     CNP Questionnaire - 01/17/17 1006      Patient Demographics   Is this a new or existing patient? New   Patient is considered a/an Refugee   Race African     Patient Assistance   Location of Patient Assistance Not Applicable   Patient's financial/insurance status Low Income;Medicaid   Uninsured Patient ([facility]) No   Patient referred to apply for the following financial assistance Not Applicable   Food insecurities addressed Not Applicable   Transportation assistance Yes   Type of Assistance [employer] Given   Assistance securing medications Yes   Type of Assistance [facility]   Educational health offerings Behavioral health;Acute disease;Safety;Medications     Encounter Details   Primary purpose of visit Acute Illness/Condition Visit;Chronic Illness/Condition Visit;Safety;Post ED/Hospitalization Visit   Was an Emergency Department visit averted? Not Applicable   Does patient have a medical provider? Yes   Patient referred to Not Applicable   Was a mental health screening completed? (GAINS tool) No   Does patient have dental issues? No   Does patient have vision issues? No   Does your patient have an abnormal blood pressure today? No   Since previous encounter, have you referred patient for abnormal blood pressure that resulted in a new diagnosis or medication change? No   Does your patient have an abnormal blood glucose today? No   Since previous encounter, have you referred patient for abnormal blood glucose that resulted in a new diagnosis or medication change? No   Was there a life-saving intervention made? No     Discharged  from the hospital yesterday.  [place] homeless [place]  Met with Social Work Theatre manager to find shelter.  Able to stay in the lobby at [facility] until Monday.  Discharge medications filled through [facility].  Encouraged client to return on Monday to discuss treatment plan and options to managing addictions.

## 2017-01-22 ENCOUNTER — Ambulatory Visit: Payer: Self-pay | Admitting: Physician Assistant

## 2017-02-01 ENCOUNTER — Emergency Department (HOSPITAL_COMMUNITY)
Admission: EM | Admit: 2017-02-01 | Discharge: 2017-02-01 | Disposition: A | Discharge status: Home / Self Care | Payer: Medicare (Managed Care) | Attending: Emergency Medicine | Admitting: Emergency Medicine

## 2017-02-01 ENCOUNTER — Emergency Department (HOSPITAL_COMMUNITY): Payer: Medicare (Managed Care)

## 2017-02-01 ENCOUNTER — Other Ambulatory Visit: Payer: Self-pay

## 2017-02-01 ENCOUNTER — Encounter (HOSPITAL_COMMUNITY): Payer: Self-pay | Admitting: Emergency Medicine

## 2017-02-01 DIAGNOSIS — Y929 Unspecified place or not applicable: Secondary | ICD-10-CM | POA: Insufficient documentation

## 2017-02-01 DIAGNOSIS — M545 Low back pain, unspecified: Secondary | ICD-10-CM

## 2017-02-01 DIAGNOSIS — Y939 Activity, unspecified: Secondary | ICD-10-CM | POA: Insufficient documentation

## 2017-02-01 DIAGNOSIS — W010XXA Fall on same level from slipping, tripping and stumbling without subsequent striking against object, initial encounter: Secondary | ICD-10-CM | POA: Insufficient documentation

## 2017-02-01 DIAGNOSIS — F17210 Nicotine dependence, cigarettes, uncomplicated: Secondary | ICD-10-CM | POA: Diagnosis not present

## 2017-02-01 DIAGNOSIS — I10 Essential (primary) hypertension: Secondary | ICD-10-CM | POA: Diagnosis not present

## 2017-02-01 DIAGNOSIS — S0990XA Unspecified injury of head, initial encounter: Secondary | ICD-10-CM | POA: Insufficient documentation

## 2017-02-01 DIAGNOSIS — Z79899 Other long term (current) drug therapy: Secondary | ICD-10-CM | POA: Insufficient documentation

## 2017-02-01 DIAGNOSIS — M542 Cervicalgia: Secondary | ICD-10-CM | POA: Diagnosis present

## 2017-02-01 DIAGNOSIS — W19XXXA Unspecified fall, initial encounter: Secondary | ICD-10-CM

## 2017-02-01 DIAGNOSIS — Y998 Other external cause status: Secondary | ICD-10-CM | POA: Diagnosis not present

## 2017-02-01 HISTORY — DX: Alcohol abuse, uncomplicated: F10.10

## 2017-02-01 HISTORY — DX: Other psychoactive substance abuse, uncomplicated: F19.10

## 2017-02-01 MED ORDER — PANTOPRAZOLE SODIUM 40 MG PO TBEC: 40.0000 mg | DELAYED_RELEASE_TABLET | Freq: Every day | ORAL | 0 refills | Status: AC

## 2017-02-01 MED ORDER — FOLIC ACID 1 MG PO TABS: 1.0000 mg | ORAL_TABLET | Freq: Every day | ORAL | 0 refills | Status: AC

## 2017-02-01 MED ORDER — THIAMINE HCL 100 MG PO TABS: 100.0000 mg | ORAL_TABLET | Freq: Every day | ORAL | 0 refills | Status: AC

## 2017-02-01 NOTE — ED Triage Notes (Signed)
Pt stated that he slipped on steps and struck back of head on a step. Pt stated that he walked to the store after he fell. Pt stated that"someone woke him up" after fe fell.  Pt is alert, oriented and ambulatory. C-collar in place. Wet clothing removed, warm blankets applied. O2 sat 100%

## 2017-02-01 NOTE — ED Provider Notes (Signed)
[facility]-EMERGENCY DEPT Provider Note   CSN: [id number] Arrival date & time: 02/01/17  1214     History   Chief Complaint Chief Complaint  Patient presents with  . Fall  . Neck Pain  . Back Pain    HPI Brandon [name] is a 46 y.o. male with history of hypertension, hepatitis B/C, pancreatitis, alcohol abuse, GI bleed, esophagitis presents to the ED for evaluation of headache, neck pain and bilateral low back pain onset earlier today after mechanical fall. Patient states he slipped on the rain and fell backwards, landing on his buttocks and then hitting his neck and posterior head on the ground. States he thinks he passed out because somebody woke him up "15-20 minutes after I fell". He was ambulatory after fall, was driven to a nearby store where the clerk called 911. Currently he reports mild, posterior headache and TL spine back pain. Back pain is worse with palpation and walking. Has not tried anything at home for pain.  Chart reveals recent admission for acute GI bleed one month ago, states he ran out of medications and hasn't taken them in 3 days. Noticed darker stool 4 days ago, but none since. No diarrhea. Had one beer last night, denies other illicit drug use. Was supposed to f/u with GI Leone Payor) after discharge, but has not. Denies preceding dizziness,  light-headedness, palpitations, generalized weakness or SOB before fall today.   Denies changes in vision, nausea, vomiting, dizziness, Cp, SOB, abdominal pain, numbness/tingling/weakness to extremities. HPI  Past Medical History:  Diagnosis Date  . Alcohol abuse   . Chronic hepatitis B (H[facility]) 01/15/2017  . Erosive esophagitis   . Erosive gastritis with hemorrhage   . Hepatitis C, chronic (HCC) Genotype 1a 01/15/2017  . Hypertension   . Substance abuse [facility])     Patient Active Problem List   Diagnosis Date Noted  . Hepatitis C, chronic (HCC) Genotype 1a 01/15/2017  . Chronic hepatitis B (HCC)  01/15/2017  . Erosive gastritis with hemorrhage   . Erosive esophagitis   . Acute blood loss anemia 01/10/2017  . Abnormal LFTs   . Polysubstance dependence including opioid type drug without complication, episodic abuse (HCC) 06/09/2016  . Substance induced mood disorder (HCC) 06/09/2016    Past Surgical History:  Procedure Laterality Date  . ESOPHAGOGASTRODUODENOSCOPY N/A 01/11/2017   Procedure: ESOPHAGOGASTRODUODENOSCOPY (EGD);  Surgeon: Iva Boop, MD;  Location: [facility];  Service: Endoscopy;  Laterality: N/A;       Home Medications    Prior to Admission medications   Medication Sig Start Date End Date Taking? Authorizing Provider  albuterol (PROVENTIL HFA;VENTOLIN HFA) 108 (90 Base) MCG/ACT inhaler Inhale 2 puffs into the lungs every 6 (six) hours as needed for wheezing or shortness of breath. Patient not taking: Reported on 01/10/2017 04/14/16   Lizbeth Bark, FNP  folic acid (FOLVITE) 1 MG tablet Take 1 tablet (1 mg total) daily by mouth. 02/01/17   Liberty Handy, PA-C  Multiple Vitamin (MULTIVITAMIN WITH MINERALS) TABS tablet Take 1 tablet by mouth daily. 01/12/17   Glade Lloyd, MD  ondansetron (ZOFRAN) 4 MG tablet Take 1 tablet (4 mg total) by mouth every 6 (six) hours as needed for nausea. 01/11/17   Glade Lloyd, MD  pantoprazole (PROTONIX) 40 MG tablet Take 1 tablet (40 mg total) daily before breakfast by mouth. 02/01/17   Liberty Handy, PA-C  thiamine 100 MG tablet Take 1 tablet (100 mg total) daily by mouth. 02/01/17  Liberty HandyGibbons, Jlyn Cerros J, PA-C    Family History Family History  Problem Relation Age of Onset  . Hypertension Other   . Hypertension Mother   . Cancer Father     Social History Social History   Tobacco Use  . Smoking status: Current Every Day Smoker    Packs/day: 0.50    Types: Cigarettes  . Smokeless tobacco: Never Used  Substance Use Topics  . Alcohol use: Yes    Comment: 5 drinks a day both beer and liquor  .  Drug use: Yes    Frequency: 4.0 times per week    Types: Cocaine, Marijuana     Allergies   Patient has no known allergies.   Review of Systems Review of Systems  Gastrointestinal: Positive for blood in stool.  Musculoskeletal: Positive for back pain, gait problem and neck pain.  Skin: Negative for wound.  Neurological: Positive for syncope. Negative for dizziness and light-headedness.  All other systems reviewed and are negative.    Physical Exam Updated Vital Signs BP 119/74 (BP Location: Left Arm)   Pulse 63   Temp (!) 97.5 F (36.4 C) (Oral)   Resp 20   Wt 63.5 kg (140 lb)   SpO2 100%   BMI 20.67 kg/m   Physical Exam  Constitutional: He is oriented to person, place, and time. He appears well-developed and well-nourished. No distress.  NAD  HENT:  Head: Normocephalic and atraumatic.  Right Ear: External ear normal.  Left Ear: External ear normal.  Nose: Nose normal.  No facial, nasal or scalp tenderness No intraoral or nasal bleeding or injury Moist mucous membranes  Eyes: Conjunctivae are normal. No scleral icterus.  Neck: Normal range of motion. Neck supple.  Left sided c-spine paraspinal muscle tenderness No midline c-spine tenderness or step offs In C-spine collar  Trachea midline  Cardiovascular: Normal rate, regular rhythm, normal heart sounds and intact distal pulses.  No murmur heard. 2+ radial and DP pulses bilaterally  Pulmonary/Chest: Effort normal and breath sounds normal. He has no wheezes.  Abdominal: Soft. There is no tenderness.  No epigastric tenderness   Musculoskeletal: Normal range of motion. He exhibits tenderness. He exhibits no deformity.  Midline and paraspinal muscle tenderness of TL spine, no step offs Full PROM of bilateral hips, pain with full flexion and IR/ER Negative SLR bilaterally   Neurological: He is alert and oriented to person, place, and time.  No dysarthria. No nystagmus. Strength 5/5 with hand grip and ankle  flexion/extension.   Sensation to light touch intact in hands and feet. Steady gait.  CN I and VIII not tested. CN II-XII intact bilaterally.   Skin: Skin is warm and dry. Capillary refill takes less than 2 seconds.  No ecchymosis or skin injury to back or buttocks   Psychiatric: He has a normal mood and affect. His behavior is normal. Judgment and thought content normal.  Nursing note and vitals reviewed.    ED Treatments / Results  Labs (all labs ordered are listed, but only abnormal results are displayed) Labs Reviewed - No data to display  EKG  EKG Interpretation None       Radiology Dg Thoracic Spine 2 View  Result Date: 02/01/2017 CLINICAL DATA:  Larey SeatFell down steps with back pain EXAM: THORACIC SPINE 2 VIEWS COMPARISON:  None. FINDINGS: Age indeterminate minimal wedge deformity at approximate T8 and T9 levels. Remaining vertebral body heights are maintained. IMPRESSION: Age indeterminate minimal wedge deformity at approximate T8 and T9 levels. Electronically Signed  By: Jasmine PangKim  Fujinaga M.D.   On: 02/01/2017 14:00   Dg Lumbar Spine Complete  Result Date: 02/01/2017 CLINICAL DATA:  Fall with back pain EXAM: LUMBAR SPINE - COMPLETE 4+ VIEW COMPARISON:  01/10/2017 FINDINGS: Lumbar alignment within normal limits. Mild degenerative changes at L5-S1 and L1-L2. Vertebral body heights are maintained. Metallic fragments over the upper abdomen/ lower chest on lateral view. IMPRESSION: Minimal degenerative changes.  No acute osseous abnormality. Electronically Signed   By: Jasmine PangKim  Fujinaga M.D.   On: 02/01/2017 14:02   Dg Pelvis 1-2 Views  Result Date: 02/01/2017 CLINICAL DATA:  Larey SeatFell down steps, hip pain EXAM: PELVIS - 1-2 VIEW COMPARISON:  CT 01/10/2017 FINDINGS: There is no evidence of pelvic fracture or diastasis. No pelvic bone lesions are seen. IMPRESSION: Negative. Electronically Signed   By: Jasmine PangKim  Fujinaga M.D.   On: 02/01/2017 13:58   Dg Sacrum/coccyx  Result Date:  02/01/2017 CLINICAL DATA:  Fall with tail bone pain EXAM: SACRUM AND COCCYX - 2+ VIEW COMPARISON:  None. FINDINGS: There is no evidence of fracture or other focal bone lesions. IMPRESSION: Negative. Electronically Signed   By: Jasmine PangKim  Fujinaga M.D.   On: 02/01/2017 14:02   Ct Head Wo Contrast  Result Date: 02/01/2017 CLINICAL DATA:  Slip and fall on steps.  History of substance abuse. EXAM: CT HEAD WITHOUT CONTRAST CT CERVICAL SPINE WITHOUT CONTRAST TECHNIQUE: Multidetector CT imaging of the head and cervical spine was performed following the standard protocol without intravenous contrast. Multiplanar CT image reconstructions of the cervical spine were also generated. COMPARISON:  CT HEAD and cervical spine August 28, 2016 FINDINGS: CT HEAD FINDINGS BRAIN: No intraparenchymal hemorrhage, mass effect nor midline shift. LEFT temporal lobe encephalomalacia with mild ex vacuo dilatation LEFT. Borderline general parenchymal brain volume loss for age. Temporal horn No acute large vascular territory infarcts. Punctate LEFT cerebellar calcification associated with cavernomas. No abnormal extra-axial fluid collections. Basal cisterns are patent. VASCULAR: Unremarkable. SKULL/SOFT TISSUES: No skull fracture. Multiple old facial fractures, status post RIGHT anterior more axillary wall and, RIGHT lateral orbital wall below ORIF. No significant soft tissue swelling. LEFT facial scarring. ORBITS/SINUSES: Dysconjugate gaze may be transient. Bold LEFT medial orbital blowout fracture and old LEFT orbital wall fracture. The mastoid aircells and included paranasal sinuses are well-aerated. OTHER: None. CT CERVICAL SPINE FINDINGS ALIGNMENT: Maintained lordosis. Vertebral bodies in alignment. SKULL BASE AND VERTEBRAE: Cervical vertebral bodies and posterior elements are intact. Moderate C5-6 disc height loss, mild at C4-5 and C6-7 with endplate spurring compatible with degenerative discs. Mild upper cervical facet arthropathy. C1-2  articulation maintained with moderate arthropathy. No destructive bony lesions. SOFT TISSUES AND SPINAL CANAL: 15 mm LEFT thyroid nodule. DISC LEVELS: No significant osseous canal stenosis. Moderate RIGHT C3-4, severe RIGHT greater than LEFT C5-6 and moderate LEFT C6-7 neural foraminal narrowing. UPPER CHEST: Lung apices are clear. OTHER: Poor dentition. IMPRESSION: CT HEAD: 1. No acute intracranial process. 2. 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 3. Borderline parenchymal brain volume loss for age. CT CERVICAL SPINE: 1. No acute fracture or malalignment. 2. Severe C5-6 neural foraminal narrowing. 3. **An incidental finding of potential clinical significance has been found. 15 mm LEFT thyroid nodule. Recommend thyroid sonogram on nonemergent basis. This follows ACR consensus guidelines: Managing Incidental Thyroid Nodules Detected on Imaging: White Paper of the ACR Incidental Thyroid Findings Committee. J Am Coll Radiol 2015; 12:143-150.** Electronically Signed   By: Awilda Metroourtnay  Bloomer M.D.   On: 02/01/2017 14:14   Ct Cervical Spine Wo Contrast  Result Date: 02/01/2017 CLINICAL DATA:  Slip and fall on steps.  History of substance abuse. EXAM: CT HEAD WITHOUT CONTRAST CT CERVICAL SPINE WITHOUT CONTRAST TECHNIQUE: Multidetector CT imaging of the head and cervical spine was performed following the standard protocol without intravenous contrast. Multiplanar CT image reconstructions of the cervical spine were also generated. COMPARISON:  CT HEAD and cervical spine August 28, 2016 FINDINGS: CT HEAD FINDINGS BRAIN: No intraparenchymal hemorrhage, mass effect nor midline shift. LEFT temporal lobe encephalomalacia with mild ex vacuo dilatation LEFT. Borderline general parenchymal brain volume loss for age. Temporal horn No acute large vascular territory infarcts. Punctate LEFT cerebellar calcification associated with cavernomas. No abnormal extra-axial fluid collections. Basal cisterns are patent.  VASCULAR: Unremarkable. SKULL/SOFT TISSUES: No skull fracture. Multiple old facial fractures, status post RIGHT anterior more axillary wall and, RIGHT lateral orbital wall below ORIF. No significant soft tissue swelling. LEFT facial scarring. ORBITS/SINUSES: Dysconjugate gaze may be transient. Bold LEFT medial orbital blowout fracture and old LEFT orbital wall fracture. The mastoid aircells and included paranasal sinuses are well-aerated. OTHER: None. CT CERVICAL SPINE FINDINGS ALIGNMENT: Maintained lordosis. Vertebral bodies in alignment. SKULL BASE AND VERTEBRAE: Cervical vertebral bodies and posterior elements are intact. Moderate C5-6 disc height loss, mild at C4-5 and C6-7 with endplate spurring compatible with degenerative discs. Mild upper cervical facet arthropathy. C1-2 articulation maintained with moderate arthropathy. No destructive bony lesions. SOFT TISSUES AND SPINAL CANAL: 15 mm LEFT thyroid nodule. DISC LEVELS: No significant osseous canal stenosis. Moderate RIGHT C3-4, severe RIGHT greater than LEFT C5-6 and moderate LEFT C6-7 neural foraminal narrowing. UPPER CHEST: Lung apices are clear. OTHER: Poor dentition. IMPRESSION: CT HEAD: 1. No acute intracranial process. 2. 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 3. Borderline parenchymal brain volume loss for age. CT CERVICAL SPINE: 1. No acute fracture or malalignment. 2. Severe C5-6 neural foraminal narrowing. 3. **An incidental finding of potential clinical significance has been found. 15 mm LEFT thyroid nodule. Recommend thyroid sonogram on nonemergent basis. This follows ACR consensus guidelines: Managing Incidental Thyroid Nodules Detected on Imaging: White Paper of the ACR Incidental Thyroid Findings Committee. J Am Coll Radiol 2015; 12:143-150.** Electronically Signed   By: Awilda Metro M.D.   On: 02/01/2017 14:14    Procedures Procedures (including critical care time)  Medications Ordered in ED Medications - No  data to display   Initial Impression / Assessment and Plan / ED Course  I have reviewed the triage vital signs and the nursing notes.  Pertinent labs & imaging results that were available during my care of the patient were reviewed by me and considered in my medical decision making (see chart for details).  Clinical Course as of Feb 01 1500  Thu Feb 01, 2017  1450 CT HEAD: 1. No acute intracranial process. 2. 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 3. Borderline parenchymal brain volume loss for age.  CT CERVICAL SPINE: 1. No acute fracture or malalignment. 2. Severe C5-6 neural foraminal narrowing. 3. **An incidental finding of potential clinical significance has been found. 15 mm LEFT thyroid nodule. Recommend thyroid sonogram on nonemergent basis. This follows ACR consensus guidelines: Managing Incidental Thyroid Nodules Detected on Imaging: White Paper of the ACR Incidental Thyroid Findings Committee. J Am Coll Radiol 2015; 12:143-150.*  [CG]    Clinical Course User Index [CG] Liberty Handy, PA-C   46 year old male presents for evaluation after mechanical fall, reports posterior head pain and low back pain since. Questionable LOC. Imaging today is unremarkable. CT  of head shows left temporal encephalomalacia which was also noted on last CT, all other CTs and x-rays unremarkable. Exam today is reassuring. Of note, patient was admitted for acute GI bleed and dropping hemoglobin 1 month ago. He was supposed to follow up with GI after discharge. He has not taken his medications in the last 3 days, reports noticing one darker stool 4 days ago but none since. He denies any prodromal symptoms including dizziness, lightheadedness, chest pain or shortness of breath before the fall today.  He reassures me that he slipped on the rain and fell backwards. Given reassuring exam and imaging, I don't think further emergent lab work or imaging is indicated today. We'll refill  his prescriptions and encourage to follow up with GI as outpatient. Encouraged alcohol cessation. He tolerated oral challenge and ambulated in the ED without difficulty.  Final Clinical Impressions(s) / ED Diagnoses   Final diagnoses:  Fall, initial encounter  Neck pain  Acute bilateral low back pain without sciatica    ED Discharge Orders        Ordered    folic acid (FOLVITE) 1 MG tablet  Daily     02/01/17 1457    pantoprazole (PROTONIX) 40 MG tablet  Daily before breakfast     02/01/17 1457    thiamine 100 MG tablet  Daily     02/01/17 1457       Liberty Handy, [place] 02/01/17 1501    Nira Conn, MD 02/06/17 2357

## 2017-02-01 NOTE — ED Triage Notes (Signed)
[facility] Patient then walked to gas station where called EMS over hour later for his neck and back pain. [facility] but wouldn't give information about what house he was at where he fell.  Patient has PMH back problems. c-collar in place.

## 2017-02-01 NOTE — Discharge Instructions (Signed)
Your imaging was normal today.   Please take tylenol for pain as needed. Rest.   Follow up with your primary care doctor if your neck and back pain persist.   Your medications have been refilled. START TAKING ALL YOUR HOME MEDICATIONS AS PRESCRIBED. Avoid alcohol. Follow up with GI doctor(Gessner) as soon as possible

## 2017-02-05 ENCOUNTER — Encounter: Payer: Self-pay | Admitting: Pediatric Intensive Care

## 2017-02-05 MED FILL — PANTOPRAZOLE SOD DR 40 MG T: 40 | 30 days supply | Qty: 30 | Fill #0

## 2017-02-05 MED FILL — FOLIC ACID 1 MG TABLET: 1 | 30 days supply | Qty: 30 | Fill #0

## 2017-02-06 ENCOUNTER — Telehealth: Payer: Self-pay | Admitting: Family Medicine

## 2017-02-06 ENCOUNTER — Ambulatory Visit: Payer: Self-pay | Admitting: Infectious Diseases

## 2017-02-06 NOTE — Telephone Encounter (Signed)
Call placed to patient #[phone] to inform him that we receive his information from hospital case manager to help him schedule a hospital follow up. Unable to reach patient. A recording came out stating that, "this phone number is not in use". U

## 2017-02-06 NOTE — Congregational Nurse Program (Signed)
Congregational Nurse Program Note  Date of Encounter: 02/05/2017  Past Medical History: Past Medical History:  Diagnosis Date  . Alcohol abuse   . Chronic hepatitis B (HCC) 01/15/2017  . Erosive esophagitis   . Erosive gastritis with hemorrhage   . Hepatitis C, chronic (HCC) Genotype 1a 01/15/2017  . Hypertension   . Substance abuse [facility](HCC)     Encounter Details: CNP Questionnaire - 02/05/17 1230      Questionnaire   Patient Status  Refugee    Race  African    Location Patient Served At  [employer]    Insurance  Medicare    Uninsured  Not Applicable    Food  No food insecurities    Housing/Utilities  No permanent housing    Transportation  Yes, need transportation assistance    Interpersonal Safety  Yes, feel physically and emotionally safe where you currently live    Medication  Yes, have medication insecurities;Provided medication assistance    Medical Provider  Yes    Referrals  Other;Behavioral/Mental Health Provider    ED Visit Averted  Not Applicable    Life-Saving Intervention Made  Not Applicable      Client brought original copies of prescriptions from ED visit on 11/15. CN had received copies from client and requested client physically come to clinic. Client states that he has not made follow up appointments with GI or PCP. CN will call [facility] and [facility] to make appointments. CN advised client to come to clinic in AM to get bus passes to [facility]alk Evans BH [facility] and give to client in AM. Client agrees to plan.

## 2017-02-07 ENCOUNTER — Telehealth: Payer: Self-pay

## 2017-02-07 NOTE — Telephone Encounter (Signed)
[facility] to discuss need for [facility].  He had been in ED on 111/5/18.  Informed her that this office was not able to reach him yesterday  - his phone # is not in use.  She is familiar with the patient and Orland PenmanCharlotte Evans/RN/[facility] is also familiar with the patient and have been trying to assist him obtaining his medications and scheduling follow up appointments. Instructed her to contact this CM if [facility] and this CM will attempt to schedule [facility] an [facility].   Update provided to Eldridge AbrahamsAngela Kritzer, RN CM

## 2017-02-16 ENCOUNTER — Ambulatory Visit: Payer: Self-pay | Admitting: Physician Assistant

## 2018-07-13 ENCOUNTER — Emergency Department (HOSPITAL_COMMUNITY): Payer: Medicare (Managed Care)

## 2018-07-13 ENCOUNTER — Emergency Department (HOSPITAL_COMMUNITY)
Admission: EM | Admit: 2018-07-13 | Discharge: 2018-07-13 | Disposition: A | Discharge status: Home / Self Care | Payer: Medicare (Managed Care) | Attending: Emergency Medicine | Admitting: Emergency Medicine

## 2018-07-13 ENCOUNTER — Encounter (HOSPITAL_COMMUNITY): Payer: Self-pay

## 2018-07-13 ENCOUNTER — Other Ambulatory Visit: Payer: Self-pay

## 2018-07-13 DIAGNOSIS — R51 Headache: Secondary | ICD-10-CM | POA: Diagnosis present

## 2018-07-13 DIAGNOSIS — Y999 Unspecified external cause status: Secondary | ICD-10-CM | POA: Diagnosis not present

## 2018-07-13 DIAGNOSIS — S01511A Laceration without foreign body of lip, initial encounter: Secondary | ICD-10-CM | POA: Diagnosis not present

## 2018-07-13 DIAGNOSIS — F17210 Nicotine dependence, cigarettes, uncomplicated: Secondary | ICD-10-CM | POA: Insufficient documentation

## 2018-07-13 DIAGNOSIS — Y939 Activity, unspecified: Secondary | ICD-10-CM | POA: Insufficient documentation

## 2018-07-13 DIAGNOSIS — S025XXA Fracture of tooth (traumatic), initial encounter for closed fracture: Secondary | ICD-10-CM | POA: Insufficient documentation

## 2018-07-13 DIAGNOSIS — F1290 Cannabis use, unspecified, uncomplicated: Secondary | ICD-10-CM | POA: Diagnosis not present

## 2018-07-13 DIAGNOSIS — M542 Cervicalgia: Secondary | ICD-10-CM | POA: Insufficient documentation

## 2018-07-13 DIAGNOSIS — Z79899 Other long term (current) drug therapy: Secondary | ICD-10-CM | POA: Insufficient documentation

## 2018-07-13 DIAGNOSIS — Y929 Unspecified place or not applicable: Secondary | ICD-10-CM | POA: Diagnosis not present

## 2018-07-13 DIAGNOSIS — S032XXA Dislocation of tooth, initial encounter: Secondary | ICD-10-CM | POA: Diagnosis not present

## 2018-07-13 DIAGNOSIS — I10 Essential (primary) hypertension: Secondary | ICD-10-CM | POA: Diagnosis not present

## 2018-07-13 DIAGNOSIS — F1490 Cocaine use, unspecified, uncomplicated: Secondary | ICD-10-CM | POA: Insufficient documentation

## 2018-07-13 DIAGNOSIS — K0889 Other specified disorders of teeth and supporting structures: Secondary | ICD-10-CM

## 2018-07-13 MED ORDER — OXYCODONE-ACETAMINOPHEN 5-325 MG PO TABS
1.0000 | ORAL_TABLET | Freq: Once | ORAL | Status: AC
  Administered 2018-07-13: 1 via ORAL
  Filled 2018-07-13: qty 1

## 2018-07-13 MED ORDER — PENICILLIN G BENZATHINE 1200000 UNIT/2ML IM SUSP
1.2000 10*6.[IU] | Freq: Once | INTRAMUSCULAR | Status: AC
  Administered 2018-07-13: 1.2 10*6.[IU] via INTRAMUSCULAR
  Filled 2018-07-13: qty 2

## 2018-07-13 MED ORDER — CHLORHEXIDINE GLUCONATE 0.12 % MT SOLN
15.0000 mL | Freq: Four times a day (QID) | OROMUCOSAL | Status: DC
  Administered 2018-07-13: 15 mL via OROMUCOSAL
  Filled 2018-07-13 (×4): qty 15

## 2018-07-13 MED ORDER — CHLORHEXIDINE GLUCONATE 0.12 % MT SOLN: 15.0000 mL | Freq: Two times a day (BID) | OROMUCOSAL | 0 refills | Status: AC

## 2018-07-13 MED ORDER — AMOXICILLIN 500 MG PO CAPS: 500.0000 mg | ORAL_CAPSULE | Freq: Three times a day (TID) | ORAL | 0 refills | Status: AC

## 2018-07-13 NOTE — ED Notes (Signed)
Patient transported to CT 
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
## 2018-07-13 NOTE — Discharge Instructions (Addendum)
You need to change her diet and to completely soft foods, do not put any pressure on these teeth.  If you do not do this, they will not heal and they will fall out.  If you develop fever, facial swelling, drainage from the area, return to the ER.

## 2018-07-13 NOTE — ED Notes (Signed)
Unable to get temp at this moment due to pt talking to [employer]
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
## 2018-09-26 IMAGING — CT CT ABD-PELV W/ CM
2 of 5 series · 15 of 46 positions shown, 17 images · IV contrast (Omni 300)
Comparison: None.

CLINICAL DATA: Lower abdominal and back pain. Intermittent melena
and hematemesis.

EXAM:
CT ABDOMEN AND PELVIS WITH CONTRAST
TECHNIQUE: Multidetector CT imaging of the abdomen and pelvis was performed
using the standard protocol following bolus administration of
intravenous contrast.
CONTRAST:  100 mL 1XFDUR-UFF IOPAMIDOL (1XFDUR-UFF) INJECTION 61%

[Series 3: a/p w/ 5mm · axial · 0.79mm/px · z∈[+710,+1160]mm · 12 of 100 slices shown, 14 images]
[im 5/100  soft-tissue]
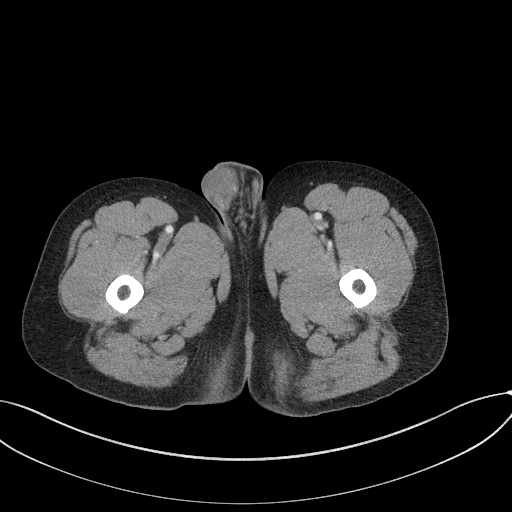
[im 5/100  bone]
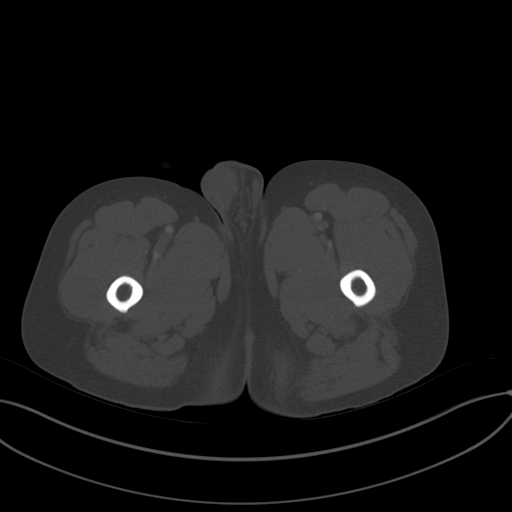
[im 15/100  soft-tissue]
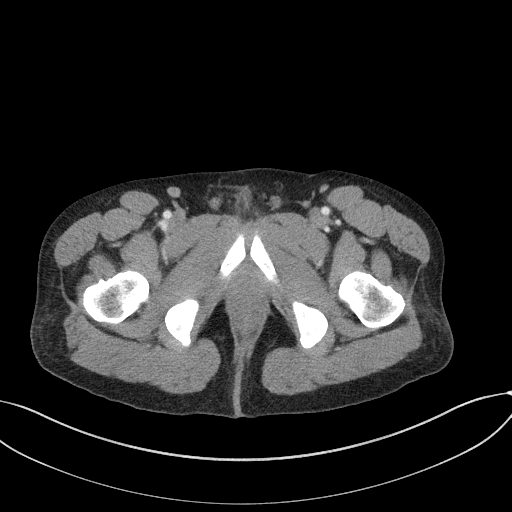
[im 24/100  soft-tissue]
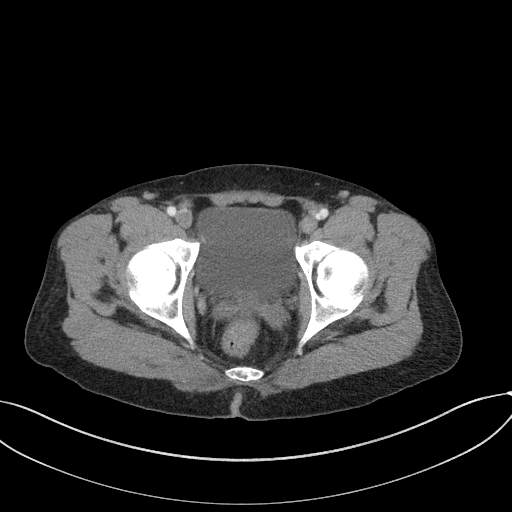
[im 29/100  soft-tissue]
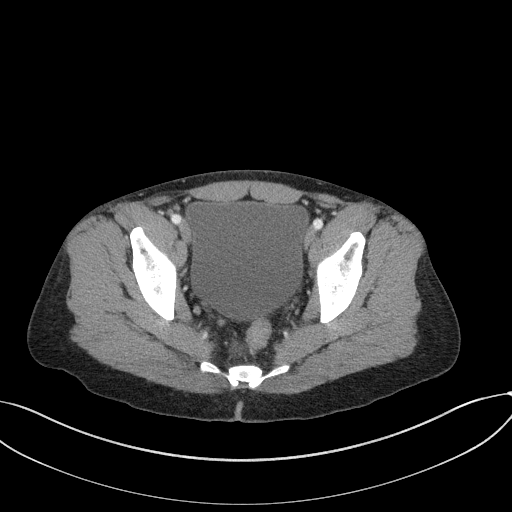
[im 38/100  soft-tissue]
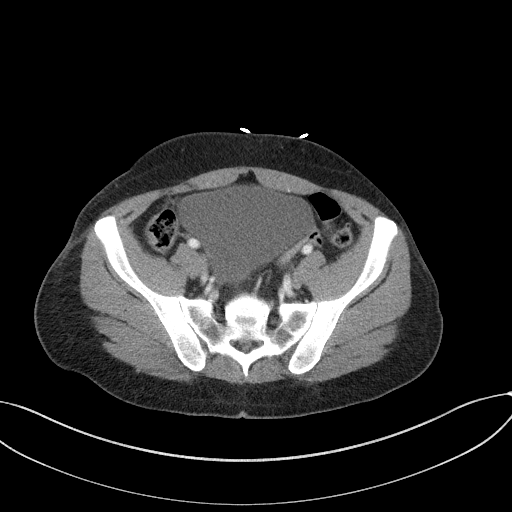
[im 48/100  soft-tissue]
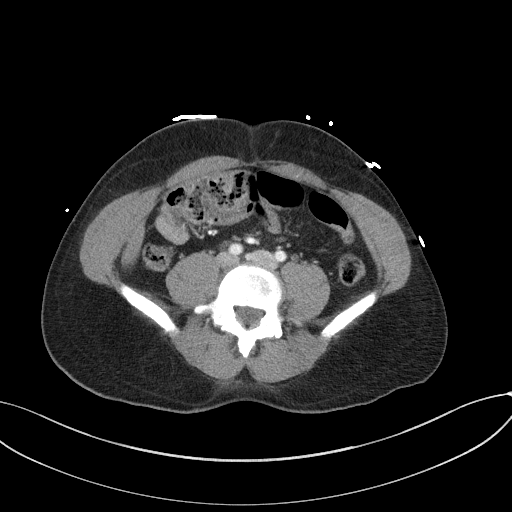
[im 52/100  soft-tissue]
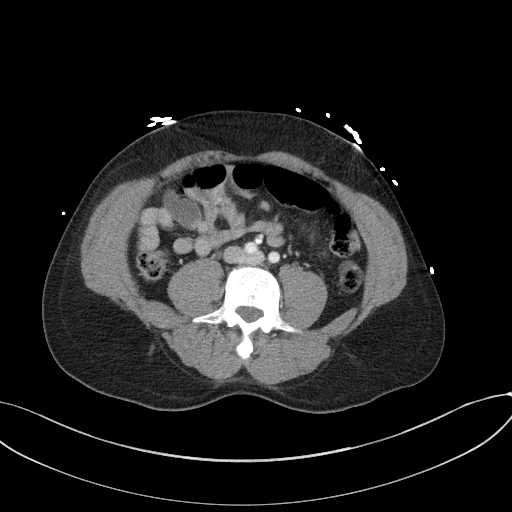
[im 62/100  soft-tissue]
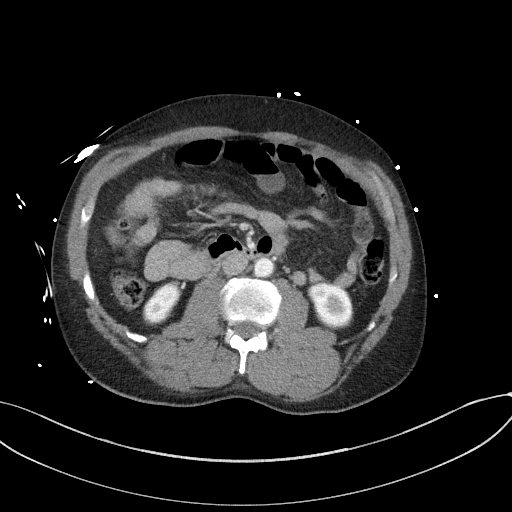
[im 71/100  soft-tissue]
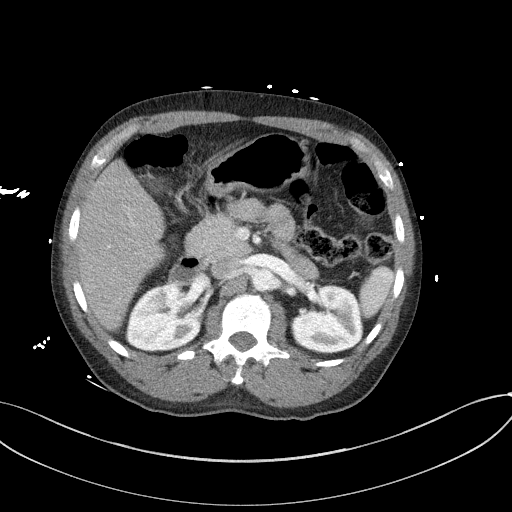
[im 71/100  bone]
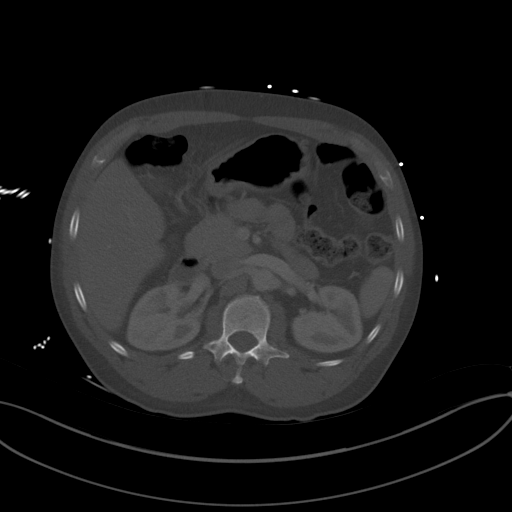
[im 76/100  soft-tissue]
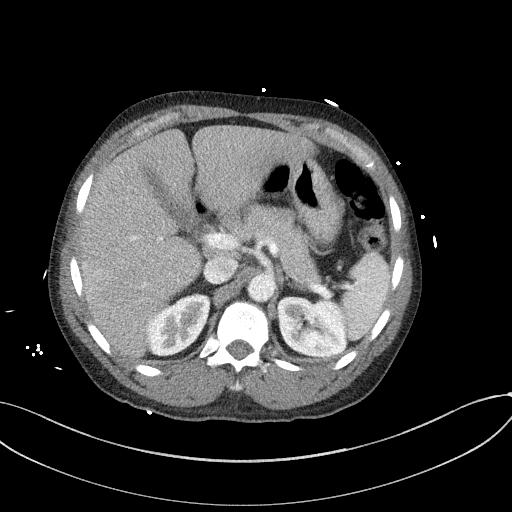
[im 85/100  soft-tissue]
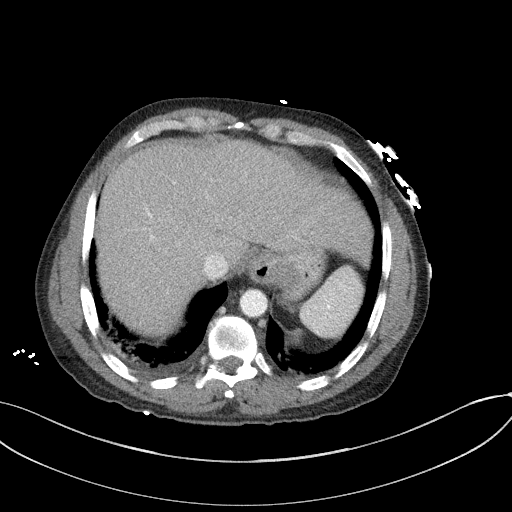
[im 95/100  soft-tissue]
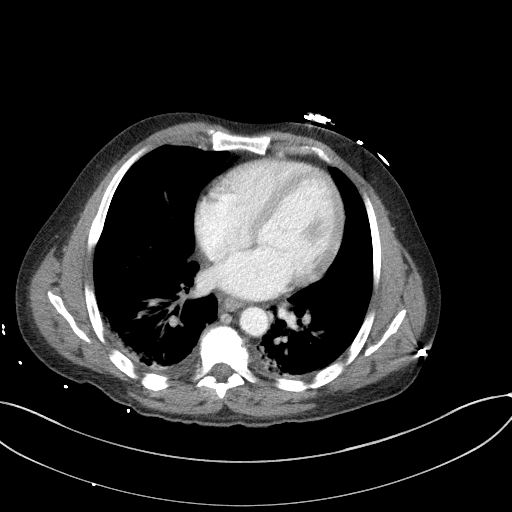

[Series 6: a/p w/ cor · coronal · 0.68mm/px · 3 of 129 slices shown]
[im 43/129  soft-tissue]
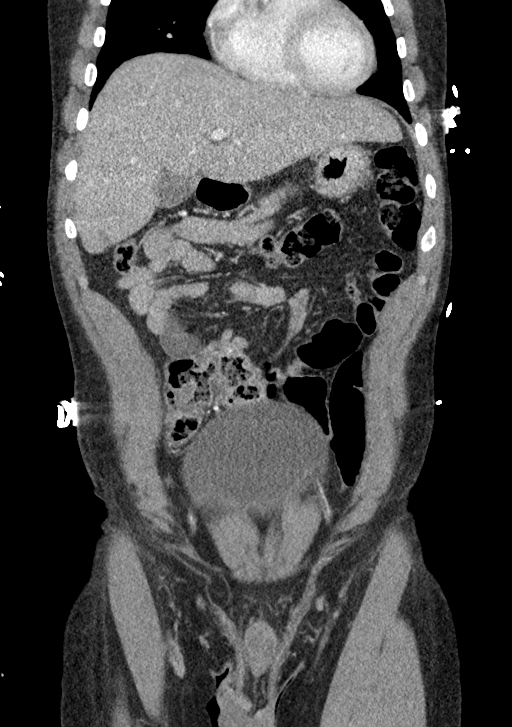
[im 57/129  soft-tissue]
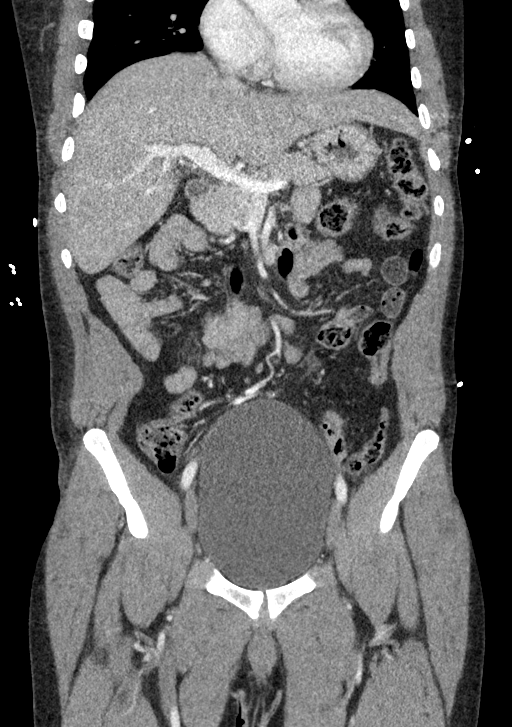
[im 72/129  soft-tissue]
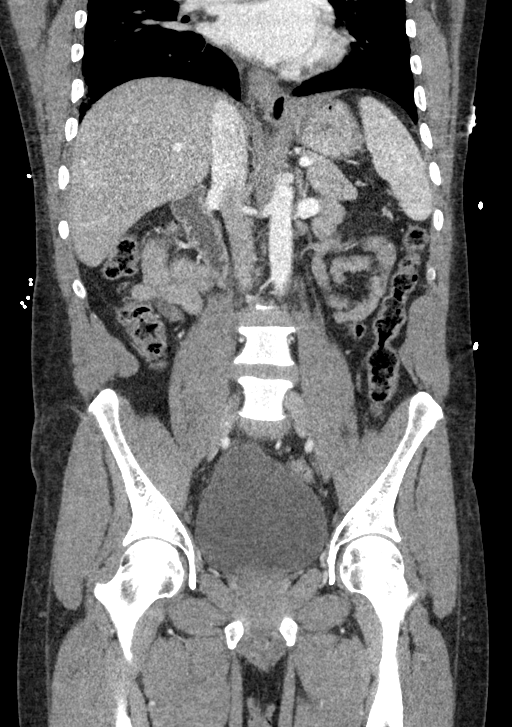

[15 of 46 positions shown; findings below may reference images not displayed]

FINDINGS: Lower Chest: Tiny bilateral pleural effusions and mild dependent
atelectasis. Right lower lung pulmonary nodule measuring at least 4
mm seen on image [DATE], possibly incompletely visualized.

Hepatobiliary: Liver has normal gross morphology. No hepatic masses
identified. Gallbladder is unremarkable.

Pancreas:  No mass or inflammatory changes.

Spleen: Within normal limits in size and appearance.

Adrenals/Urinary Tract: No masses identified. Probable tiny sub-cm
left renal cyst. No evidence of hydronephrosis.

Stomach/Bowel: Tiny hiatal hernia seen with mild wall thickening
involving distal esophagus and GE junction. No evidence of distal
esophageal dilatation. No small or large bowel abnormality seen.
Although the appendix is not directly visualized, no inflammatory
process seen in region of the cecum or elsewhere.

Vascular/Lymphatic: No pathologically enlarged lymph nodes. No
abdominal aortic aneurysm.

Reproductive:  No mass or other significant abnormality.

Other:  None.

Musculoskeletal:  No suspicious bone lesions identified.
IMPRESSION: Tiny hiatal hernia with mild wall thickening involving the distal
esophagus and GE junction. This is suspicious for esophagitis, with
esophageal carcinoma considered much less likely. Consider upper
endoscopy for further evaluation .

Tiny bilateral pleural effusions. Indeterminate right lower lung
pulmonary nodule measuring at least 4 mm, possibly incompletely
visualized on this study. Chest CT without contrast is recommended
for further evaluation.

## 2020-11-02 ENCOUNTER — Other Ambulatory Visit: Payer: Self-pay

## 2020-11-02 ENCOUNTER — Encounter (HOSPITAL_COMMUNITY): Payer: Self-pay

## 2020-11-02 ENCOUNTER — Ambulatory Visit (HOSPITAL_COMMUNITY)
Admission: EM | Admit: 2020-11-02 | Discharge: 2020-11-03 | Disposition: A | Discharge status: Home / Self Care | Payer: Medicare (Managed Care) | Attending: Emergency Medicine | Admitting: Emergency Medicine

## 2020-11-02 DIAGNOSIS — T18128A Food in esophagus causing other injury, initial encounter: Secondary | ICD-10-CM | POA: Diagnosis not present

## 2020-11-02 DIAGNOSIS — K449 Diaphragmatic hernia without obstruction or gangrene: Secondary | ICD-10-CM | POA: Insufficient documentation

## 2020-11-02 DIAGNOSIS — T189XXA Foreign body of alimentary tract, part unspecified, initial encounter: Secondary | ICD-10-CM | POA: Diagnosis present

## 2020-11-02 DIAGNOSIS — X58XXXA Exposure to other specified factors, initial encounter: Secondary | ICD-10-CM | POA: Insufficient documentation

## 2020-11-02 DIAGNOSIS — Z20822 Contact with and (suspected) exposure to covid-19: Secondary | ICD-10-CM | POA: Diagnosis not present

## 2020-11-02 DIAGNOSIS — K2101 Gastro-esophageal reflux disease with esophagitis, with bleeding: Secondary | ICD-10-CM | POA: Diagnosis not present
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
## 2020-11-03 ENCOUNTER — Emergency Department (HOSPITAL_COMMUNITY): Payer: Medicare (Managed Care)

## 2020-11-03 ENCOUNTER — Encounter (HOSPITAL_COMMUNITY): Payer: Self-pay

## 2020-11-03 ENCOUNTER — Encounter (HOSPITAL_COMMUNITY)
Admission: EM | Disposition: A | Discharge status: Home / Self Care | Payer: Self-pay | Source: Home / Self Care | Attending: Emergency Medicine

## 2020-11-03 ENCOUNTER — Emergency Department (HOSPITAL_COMMUNITY): Payer: Medicare (Managed Care) | Admitting: Anesthesiology

## 2020-11-03 DIAGNOSIS — T18128A Food in esophagus causing other injury, initial encounter: Secondary | ICD-10-CM | POA: Diagnosis not present

## 2020-11-03 DIAGNOSIS — K2101 Gastro-esophageal reflux disease with esophagitis, with bleeding: Secondary | ICD-10-CM | POA: Diagnosis not present

## 2020-11-03 DIAGNOSIS — Z20822 Contact with and (suspected) exposure to covid-19: Secondary | ICD-10-CM | POA: Diagnosis not present

## 2020-11-03 DIAGNOSIS — K449 Diaphragmatic hernia without obstruction or gangrene: Secondary | ICD-10-CM | POA: Diagnosis not present

## 2020-11-03 HISTORY — PX: FOREIGN BODY REMOVAL: SHX962

## 2020-11-03 HISTORY — PX: ESOPHAGOGASTRODUODENOSCOPY (EGD) WITH PROPOFOL: SHX5813

## 2020-11-03 LAB — RESP PANEL BY RT-PCR (FLU A&B, COVID) ARPGX2
Influenza A by PCR: NEGATIVE
Influenza B by PCR: NEGATIVE
SARS Coronavirus 2 by RT PCR: NEGATIVE

## 2020-11-03 LAB — COMPREHENSIVE METABOLIC PANEL
ALT: 84 U/L — ABNORMAL HIGH (ref 0–44)
AST: 84 U/L — ABNORMAL HIGH (ref 15–41)
Albumin: 4.2 g/dL (ref 3.5–5.0)
Alkaline Phosphatase: 60 U/L (ref 38–126)
Anion gap: 8 (ref 5–15)
BUN: 11 mg/dL (ref 6–20)
CO2: 24 mmol/L (ref 22–32)
Calcium: 9.2 mg/dL (ref 8.9–10.3)
Chloride: 103 mmol/L (ref 98–111)
Creatinine, Ser: 0.91 mg/dL (ref 0.61–1.24)
GFR, Estimated: >60 mL/min (ref >60)
Glucose, Bld: 116 mg/dL — ABNORMAL HIGH (ref 70–99)
Potassium: 3.6 mmol/L (ref 3.5–5.1)
Sodium: 135 mmol/L (ref 135–145)
Total Bilirubin: 0.8 mg/dL (ref 0.3–1.2)
Total Protein: 9.4 g/dL — ABNORMAL HIGH (ref 6.5–8.1)

## 2020-11-03 LAB — CBC
HCT: 46 % (ref 39.0–52.0)
Hemoglobin: 15.1 g/dL (ref 13.0–17.0)
MCH: 31.2 pg (ref 26.0–34.0)
MCHC: 32.8 g/dL (ref 30.0–36.0)
MCV: 95 fL (ref 80.0–100.0)
Platelets: 200 10*3/uL (ref 150–400)
RBC: 4.84 MIL/uL (ref 4.22–5.81)
RDW: 13.9 % (ref 11.5–15.5)
WBC: 5.4 10*3/uL (ref 4.0–10.5)
nRBC: 0 % (ref 0.0–0.2)

## 2020-11-03 SURGERY — ESOPHAGOGASTRODUODENOSCOPY (EGD) WITH PROPOFOL
Anesthesia: General

## 2020-11-03 SURGERY — EGD (ESOPHAGOGASTRODUODENOSCOPY)
Anesthesia: Monitor Anesthesia Care

## 2020-11-03 MED ORDER — SUCCINYLCHOLINE CHLORIDE 200 MG/10ML IV SOSY
PREFILLED_SYRINGE | INTRAVENOUS | Status: DC | PRN
  Administered 2020-11-03: 120 mg via INTRAVENOUS

## 2020-11-03 MED ORDER — PANTOPRAZOLE SODIUM 20 MG PO TBEC: 40.0000 mg | DELAYED_RELEASE_TABLET | Freq: Every day | ORAL | 0 refills | Status: AC

## 2020-11-03 MED ORDER — GLUCAGON HCL RDNA (DIAGNOSTIC) 1 MG IJ SOLR
2.0000 mg | Freq: Once | INTRAMUSCULAR | Status: AC
  Administered 2020-11-03: 2 mg via INTRAVENOUS
  Filled 2020-11-03: qty 2

## 2020-11-03 MED ORDER — DEXAMETHASONE SODIUM PHOSPHATE 10 MG/ML IJ SOLN
INTRAMUSCULAR | Status: DC | PRN
  Administered 2020-11-03: 10 mg via INTRAVENOUS

## 2020-11-03 MED ORDER — MIDAZOLAM HCL 5 MG/5ML IJ SOLN
INTRAMUSCULAR | Status: DC | PRN
Start: 2020-11-03 — End: 2020-11-03
  Administered 2020-11-03: 1 mg via INTRAVENOUS

## 2020-11-03 MED ORDER — FENTANYL CITRATE (PF) 100 MCG/2ML IJ SOLN
INTRAMUSCULAR | Status: AC
  Filled 2020-11-03: qty 2

## 2020-11-03 MED ORDER — PANTOPRAZOLE SODIUM 20 MG PO TBEC: 40.0000 mg | DELAYED_RELEASE_TABLET | Freq: Every day | ORAL | 0 refills | Status: DC

## 2020-11-03 MED ORDER — SODIUM CHLORIDE 0.9 % IV SOLN: INTRAVENOUS | Status: DC

## 2020-11-03 MED ORDER — MIDAZOLAM HCL 2 MG/2ML IJ SOLN
INTRAMUSCULAR | Status: AC
  Filled 2020-11-03: qty 2

## 2020-11-03 MED ORDER — PROMETHAZINE HCL 25 MG/ML IJ SOLN: 6.2500 mg | INTRAMUSCULAR | Status: DC | PRN

## 2020-11-03 MED ORDER — LIDOCAINE 2% (20 MG/ML) 5 ML SYRINGE
INTRAMUSCULAR | Status: DC | PRN
  Administered 2020-11-03: 100 mg via INTRAVENOUS

## 2020-11-03 MED ORDER — GLUCAGON HCL RDNA (DIAGNOSTIC) 1 MG IJ SOLR
1.0000 mg | Freq: Once | INTRAMUSCULAR | Status: AC
  Administered 2020-11-03: 1 mg via INTRAVENOUS
  Filled 2020-11-03: qty 1

## 2020-11-03 MED ORDER — FENTANYL CITRATE (PF) 100 MCG/2ML IJ SOLN
INTRAMUSCULAR | Status: DC | PRN
  Administered 2020-11-03: 50 ug via INTRAVENOUS

## 2020-11-03 MED ORDER — PROPOFOL 10 MG/ML IV BOLUS
INTRAVENOUS | Status: DC | PRN
  Administered 2020-11-03: 150 mg via INTRAVENOUS

## 2020-11-03 MED ORDER — ONDANSETRON HCL 4 MG/2ML IJ SOLN
INTRAMUSCULAR | Status: DC | PRN
  Administered 2020-11-03: 4 mg via INTRAVENOUS

## 2020-11-03 MED ORDER — LACTATED RINGERS IV SOLN
INTRAVENOUS | Status: AC | PRN
  Administered 2020-11-03: 20 mL/h via INTRAVENOUS

## 2020-11-03 SURGICAL SUPPLY — 14 items

## 2020-11-03 NOTE — ED Provider Notes (Addendum)
[facility]ENCY DEPT [facility] Emergency Department Provider Note MRN:  [id number]  Arrival date & time: 11/03/20     Chief Complaint   Foreign body History of Present Illness   Brandon [name] is a 50 y.o. year-old male with a history of no pertinent past medical history presenting to the ED with chief complaint of foreign body.  Patient thinks he has a chicken bone stuck in his throat.  Was eating chicken at 10 PM and has been having sensation of something in the throat since that time.  Denies any shortness of breath, no chest pain, no abdominal pain, no other complaints.  Cannot eat or drink anything because of the symptoms.  Pain is moderate, constant, no other exacerbating or alleviating factors.  Review of Systems  A complete 10 system review of systems was obtained and all systems are negative except as noted in the HPI and PMH.   Patient's Health History   History reviewed. No pertinent past medical history.  History reviewed. No pertinent surgical history.  No family history on file.  Social History   Socioeconomic History   Marital status: Single    Spouse name: Not on file   Number of children: Not on file   Years of education: Not on file   Highest education level: Not on file  Occupational History   Not on file  Tobacco Use   Smoking status: Never   Smokeless tobacco: Never  Substance and Sexual Activity   Alcohol use: Not on file   Drug use: Not on file   Sexual activity: Not on file  Other Topics Concern   Not on file  Social History Narrative   Not on file   Social Determinants of Health   Financial Resource Strain: Not on file  Food Insecurity: Not on file  Transportation Needs: Not on file  Physical Activity: Not on file  Stress: Not on file  Social Connections: Not on file  Intimate Partner Violence: Not on file     Physical Exam   Vitals:   11/03/20 0500 11/03/20 0600  BP: 139/86 121/71  Pulse: 78 67  Resp: 19 (!) 22  Temp:     SpO2: 99% 100%    CONSTITUTIONAL: Well-appearing, NAD NEURO:  Alert and oriented x 3, in moderate distress due to pain EYES:  eyes equal and reactive ENT/NECK:  no LAD, no JVD CARDIO: Regular rate, well-perfused, normal S1 and S2 PULM:  CTAB no wheezing or rhonchi GI/GU:  normal bowel sounds, non-distended, non-tender MSK/SPINE:  No gross deformities, no edema SKIN:  no rash, atraumatic PSYCH:  Appropriate speech and behavior  *Additional and/or pertinent findings included in MDM below  Diagnostic and Interventional Summary    EKG Interpretation  Date/Time:  Wednesday November 03 2020 03:50:45 EDT Ventricular Rate:  64 PR Interval:  143 QRS Duration: 104 QT Interval:  415 QTC Calculation: 429 R Axis:   13 Text Interpretation: Sinus rhythm Confirmed by Kennis Carina [phone]) on 11/03/2020 4:05:29 AM       Labs Reviewed  COMPREHENSIVE METABOLIC PANEL - Abnormal; Notable for the following components:      Result Value   Glucose, Bld 116 (*)    Total Protein 9.4 (*)    AST 84 (*)    ALT 84 (*)    All other components within normal limits  RESP PANEL BY RT-PCR (FLU A&B, COVID) [id number]  CBC    DG Chest 2 View  Final Result    DG Neck Soft  Tissue  Final Result      Medications - No data to display   Procedures  /  Critical Care Procedures  ED Course and Medical Decision Making  I have reviewed the triage vital signs, the nursing notes, and pertinent available records from the EMR.  Listed above are laboratory and imaging tests that I personally ordered, reviewed, and interpreted and then considered in my medical decision making (see below for details).  Concern for esophageal foreign body, possibly chicken bone, awaiting x-rays, may need to consult GI for removal.  No airway concerns at this time.     X-ray of the neck confirms a foreign body or food bolus in the esophagus.  Discussed case with Dr. Elnoria Howard of gastroenterology, who will come see the patient and perform  procedural intervention.  Radiology commenting on CT being useful to further delineate foreign body, however this seems unnecessary given the planned procedural intervention.  Dr. Elnoria Howard agrees that CT not needed.  Signed out to oncoming provider at shift change.  Elmer Sow. Pilar Plate, MD [facility] Emergency Medicine [facility] [email or web address]  Final Clinical Impressions(s) / ED Diagnoses     ICD-10-CM   1. Foreign body alimentary tract  T18.9XXA DG Chest 2 View    DG Chest 2 View      ED Discharge Orders     None        Discharge Instructions Discussed with and Provided to Patient:   Discharge Instructions   None       Sabas Sous, MD 11/03/20 [id number]    Sabas Sous, MD 11/03/20 [phone]

## 2020-11-03 NOTE — Transfer of Care (Signed)
Immediate Anesthesia Transfer of Care Note  Patient: Brandon [name]  Procedure(s) Performed: Procedure(s): ESOPHAGOGASTRODUODENOSCOPY (EGD) WITH PROPOFOL (N/A) FOREIGN BODY REMOVAL  Patient Location: PACU  Anesthesia Type:General  Level of Consciousness: Alert, Awake, Oriented  Airway & Oxygen Therapy: Patient Spontanous Breathing  Post-op Assessment: Report given to RN  Post vital signs: Reviewed and stable  Last Vitals:  Vitals:   11/03/20 1130 11/03/20 1155  BP: 134/81 (!) 161/86  Pulse:  68  Resp:  19  Temp:  37.1 C  SpO2:  99%    Complications: No apparent anesthesia complications

## 2020-11-03 NOTE — Progress Notes (Signed)
CSW provided pt with his insurance member ID numbers well as their customer service number. CSW provided bus pass for transportation home upon d/c.    Valentina Shaggy.Kensington Duerst, MSW, LCSWA [facility]  Transitions of Care Clinical Social Worker I Direct Dial: [phone]  Fax: [phone] [email or web address]

## 2020-11-03 NOTE — Anesthesia Preprocedure Evaluation (Addendum)
Anesthesia Evaluation  Patient identified by MRN, date of birth, ID band Patient awake    Reviewed: Allergy & Precautions, NPO status , Patient's Chart, lab work & pertinent test results  History of Anesthesia Complications Negative for: history of anesthetic complications  Airway Mallampati: II  TM Distance: >3 FB Neck ROM: Full    Dental  (+) Dental Advisory Given, Loose, Missing, Poor Dentition Many missing teeth, virtually all teeth remaining are loose per patient:   Pulmonary neg pulmonary ROS, Patient abstained from smoking.,    Pulmonary exam normal        Cardiovascular Normal cardiovascular exam   Possibly undiagnosed HTN    Neuro/Psych negative neurological ROS  negative psych ROS   GI/Hepatic negative GI ROS, Neg liver ROS,   Endo/Other  negative endocrine ROS  Renal/GU negative Renal ROS     Musculoskeletal negative musculoskeletal ROS (+)   Abdominal   Peds  Hematology negative hematology ROS (+)   Anesthesia Other Findings Foreign body (suspected chicken bone) stuck in throat   Reproductive/Obstetrics                            Anesthesia Physical Anesthesia Plan  ASA: 2 and emergent  Anesthesia Plan: General   Post-op Pain Management:    Induction: Intravenous and Rapid sequence  PONV Risk Score and Plan: 2 and Treatment may vary due to age or medical condition, Ondansetron and Midazolam  Airway Management Planned: Oral ETT  Additional Equipment: None  Intra-op Plan:   Post-operative Plan: Extubation in OR  Informed Consent: I have reviewed the patients History and Physical, chart, labs and discussed the procedure including the risks, benefits and alternatives for the proposed anesthesia with the patient or authorized representative who has indicated his/her understanding and acceptance.     Dental advisory given  Plan Discussed with: CRNA and  Anesthesiologist  Anesthesia Plan Comments:        Anesthesia Quick Evaluation
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
## 2020-11-03 NOTE — Progress Notes (Signed)
CSW was consulted for Medication assistance. Pt currently has insurance and does not meet the Match program requirements.     Valentina Shaggy.Joshwa Hemric, MSW, LCSWA [facility]  Transitions of Care Clinical Social Worker I Direct Dial: [phone]  Fax: [phone] [email or web address]

## 2020-11-03 NOTE — ED Notes (Signed)
Called 3x for room placement. Eloped from waiting area.  

## 2020-11-03 NOTE — Op Note (Signed)
[facility] Patient Name: Brandon [name] Procedure Date: 11/03/2020 MRN: [id number] Attending MD: Juanita Craver , MD Date of Birth: 09/09/1970 CSN: [id number] Age: 50 Admit Type: Outpatient Procedure:                EGD with removal of chicken bone in proximal                            esophagus. Indications:              Foreign body in the esophagus, GERD. Providers:                Juanita Craver, MD, Kary Kos RN, RN, Elspeth Cho Tech., Technician, Eliberto Ivory Referring MD:             Gareth Morgan, MD Medicines:                Monitored Anesthesia Care Complications:            No immediate complications. Estimated Blood Loss:     Estimated blood loss was minimal. Procedure:                Pre-Anesthesia Assessment: - Prior to the                            procedure, a history and physical was performed,                            and patient medications and allergies were                            reviewed. The patient's tolerance of previous                            anesthesia was also reviewed. The risks and                            benefits of the procedure and the sedation options                            and risks were discussed with the patient. All                            questions were answered, and informed consent was                            obtained. Prior Anticoagulants: The patient has                            taken no previous anticoagulant or antiplatelet                            agents. ASA Grade Assessment: II - A patient with  mild systemic disease. After reviewing the risks                            and benefits, the patient was deemed in                            satisfactory condition to undergo the procedure.                            After obtaining informed consent, the endoscope was                            passed under direct vision. Throughout the                             procedure, the patient's blood pressure, pulse, and                            oxygen saturations were monitored continuously. The                            [id number]-[id number] ([id number] Olympus endoscope was introduced                            through the mouth, and advanced to the second part                            of duodenum. The EGD was performed with moderate                            difficulty due to presence of food. The patient                            tolerated the procedure well. Chic Scope In: Scope Out: Findings:      Chicken bone impacted in proximal esophagus-removal was accomplished       with a large-capacity forceps.      LA Grade B (one or more mucosal breaks greater than 5 mm, not extending       between the tops of two mucosal folds) esophagitis with bleeding was       found 35 cm from the incisors.      A small hiatal hernia was noted on retroflexion.      The examined duodenum was normal. Impression:               - A chicken bone was found impacted the proximal                            esophagus-removed by forceps.                           - LA Grade B reflux esophagitis with bleeding at                            GEJ and distal esophagus.                           -  Small hiatal hernia.                           - Normal examined duodenum. Moderate Sedation:      MAC used. Recommendation:           - Soft diet daily.                           - Use Prilosec (Omeprazole) 40 mg PO daily.                           - Avoid the use of all NSAIDS.                           - Return to my office in 1 week. Procedure Code(s):        --- Professional ---                           [phone], Esophagogastroduodenoscopy, flexible,                            transoral; with removal of foreign body(s) Diagnosis Code(s):        --- Professional ---                           T18.108A, Unspecified foreign body in esophagus                             causing other injury, initial encounter                           K44.9, Diaphragmatic hernia without obstruction or                            gangrene                           K21.01, Gastro-esophageal reflux disease with                            esophagitis, with bleeding CPT copyright 2019 American Medical Association. All rights reserved. The codes documented in this report are preliminary and upon coder review may  be revised to meet current compliance requirements. Juanita Craver, MD Juanita Craver, MD 11/03/2020 1:14:18 PM This report has been signed electronically. Number of Addenda: 0

## 2020-11-03 NOTE — Anesthesia Procedure Notes (Signed)
Procedure Name: Intubation Date/Time: 11/03/2020 12:48 PM Performed by: Gerald Leitz, CRNA Pre-anesthesia Checklist: Patient identified, Patient being monitored, Timeout performed, Emergency Drugs available and Suction available Patient Re-evaluated:Patient Re-evaluated prior to induction Oxygen Delivery Method: Circle system utilized Preoxygenation: Pre-oxygenation with 100% oxygen Induction Type: IV induction Ventilation: Mask ventilation without difficulty Laryngoscope Size: Mac and 3 Grade View: Grade I Tube type: Oral Tube size: 7.5 mm Number of attempts: 1 Placement Confirmation: ETT inserted through vocal cords under direct vision, positive ETCO2 and breath sounds checked- equal and bilateral Secured at: 21 cm Tube secured with: Tape Dental Injury: Teeth and Oropharynx as per pre-operative assessment

## 2020-11-03 NOTE — ED Provider Notes (Signed)
  Physical Exam  BP 124/74   Pulse (!) 126   Temp 98.7 F (37.1 C) (Oral)   Resp 18   Wt 68 kg   SpO2 100%   Physical Exam  ED Course/Procedures     Procedures  MDM  Received care of patient from Dr. Orland Dec.  Please see his note for prior history, physical and care.  Briefly this is a 50 year old male who presents concern for food impaction.  Dr. Loreta Ave of gastroenterology has been consulted.  Dr. Loreta Ave called and recommended using glucagon.  1 mg given without any relief.  She recommends giving additional 2 mg which was given, and patient still does not have relief of his food impaction symptoms.  Called again regarding getting patient to endoscopy.  Dr. Loreta Ave reports he is scheduled for endoscopy as soon as possible.        Alvira Monday, MD 11/03/20 [phone]

## 2020-11-03 NOTE — Discharge Instructions (Addendum)
Please stay on soft diet for at least 3-4 days, after that, please be mindful as you eat, do not eat anything with bones as you are at risk of having another food impaction.

## 2020-11-04 ENCOUNTER — Encounter (HOSPITAL_COMMUNITY): Payer: Self-pay

## 2020-11-04 ENCOUNTER — Encounter (HOSPITAL_COMMUNITY): Payer: Self-pay | Admitting: Gastroenterology

## 2020-11-04 NOTE — Anesthesia Postprocedure Evaluation (Signed)
Anesthesia Post Note  Patient: Brandon [name]  Procedure(s) Performed: ESOPHAGOGASTRODUODENOSCOPY (EGD) WITH PROPOFOL FOREIGN BODY REMOVAL     Patient location during evaluation: PACU Anesthesia Type: General Level of consciousness: awake and alert Pain management: pain level controlled Vital Signs Assessment: post-procedure vital signs reviewed and stable Respiratory status: spontaneous breathing, nonlabored ventilation and respiratory function stable Cardiovascular status: stable and blood pressure returned to baseline Anesthetic complications: no   No notable events documented.  Last Vitals:  Vitals:   11/03/20 1351 11/03/20 1543  BP: 119/83 124/78  Pulse: 79 66  Resp: 18 20  Temp:  36.8 C  SpO2: 99% 99%    Last Pain:  Vitals:   11/03/20 1543  TempSrc: Oral  PainSc: 3                  Beryle Lathe

## 2024-01-19 DEATH — deceased
